# Patient Record
Sex: Male | Born: 1980 | Race: White | Hispanic: No | Marital: Single | Smoking: Current every day smoker
Health system: Southern US, Community
[De-identification: ages and names within clinical notes are randomized; demographics above are authoritative.]

## PROBLEM LIST (undated history)

## (undated) DIAGNOSIS — N289 Disorder of kidney and ureter, unspecified: Secondary | ICD-10-CM

## (undated) DIAGNOSIS — F319 Bipolar disorder, unspecified: Secondary | ICD-10-CM

## (undated) DIAGNOSIS — F32A Depression, unspecified: Secondary | ICD-10-CM

## (undated) DIAGNOSIS — F329 Major depressive disorder, single episode, unspecified: Secondary | ICD-10-CM

---

## 2001-11-13 ENCOUNTER — Encounter: Payer: Self-pay | Admitting: Orthopaedic Surgery

## 2001-11-13 ENCOUNTER — Encounter: Payer: Self-pay | Admitting: Emergency Medicine

## 2001-11-13 ENCOUNTER — Emergency Department (HOSPITAL_COMMUNITY): Admission: EM | Admit: 2001-11-13 | Discharge: 2001-11-13 | Payer: Self-pay | Admitting: Emergency Medicine

## 2009-01-17 ENCOUNTER — Emergency Department (HOSPITAL_COMMUNITY): Admission: EM | Admit: 2009-01-17 | Discharge: 2009-01-18 | Payer: Self-pay | Admitting: Emergency Medicine

## 2009-01-18 ENCOUNTER — Inpatient Hospital Stay (HOSPITAL_COMMUNITY): Admission: AD | Admit: 2009-01-18 | Discharge: 2009-01-19 | Payer: Self-pay | Admitting: Psychiatry

## 2009-01-18 ENCOUNTER — Ambulatory Visit: Payer: Self-pay | Admitting: Psychiatry

## 2010-04-01 LAB — CBC
HCT: 42.8 % (ref 39.0–52.0)
Hemoglobin: 14.6 g/dL (ref 13.0–17.0)
MCHC: 34.2 g/dL (ref 30.0–36.0)
MCV: 97.9 fL (ref 78.0–100.0)
Platelets: 274 10*3/uL (ref 150–400)
RBC: 4.37 MIL/uL (ref 4.22–5.81)
RDW: 13.2 % (ref 11.5–15.5)
WBC: 8.7 10*3/uL (ref 4.0–10.5)

## 2010-04-01 LAB — DIFFERENTIAL
Eosinophils Absolute: 0.5 10*3/uL (ref 0.0–0.7)
Lymphs Abs: 2.6 10*3/uL (ref 0.7–4.0)
Monocytes Relative: 7 % (ref 3–12)
Neutrophils Relative %: 58 % (ref 43–77)

## 2010-04-01 LAB — RAPID URINE DRUG SCREEN, HOSP PERFORMED
Amphetamines: NOT DETECTED
Barbiturates: NOT DETECTED
Benzodiazepines: POSITIVE — AB
Cocaine: POSITIVE — AB
Opiates: NOT DETECTED
Tetrahydrocannabinol: NOT DETECTED

## 2010-04-01 LAB — BASIC METABOLIC PANEL
BUN: 8 mg/dL (ref 6–23)
CO2: 30 mEq/L (ref 19–32)
Calcium: 9.5 mg/dL (ref 8.4–10.5)
Chloride: 103 mEq/L (ref 96–112)
Creatinine, Ser: 1.31 mg/dL (ref 0.4–1.5)
GFR calc Af Amer: >60 mL/min (ref >60)
GFR calc non Af Amer: >60 mL/min (ref >60)
Glucose, Bld: 92 mg/dL (ref 70–99)
Potassium: 3.9 mEq/L (ref 3.5–5.1)
Sodium: 140 mEq/L (ref 135–145)

## 2010-04-01 LAB — ETHANOL: Alcohol, Ethyl (B): <5 mg/dL (ref 0–10)

## 2010-04-01 LAB — HEPATIC FUNCTION PANEL
ALT: 23 U/L (ref 0–53)
AST: 19 U/L (ref 0–37)
Albumin: 4.3 g/dL (ref 3.5–5.2)
Alkaline Phosphatase: 70 U/L (ref 39–117)
Indirect Bilirubin: 0.2 mg/dL — ABNORMAL LOW (ref 0.3–0.9)
Total Protein: 6.9 g/dL (ref 6.0–8.3)

## 2012-05-18 ENCOUNTER — Emergency Department (HOSPITAL_COMMUNITY)
Admission: EM | Admit: 2012-05-18 | Discharge: 2012-05-19 | Disposition: A | Discharge status: Home / Self Care | Payer: Self-pay | Attending: Emergency Medicine | Admitting: Emergency Medicine

## 2012-05-18 ENCOUNTER — Encounter (HOSPITAL_COMMUNITY): Payer: Self-pay | Admitting: *Deleted

## 2012-05-18 DIAGNOSIS — T424X4A Poisoning by benzodiazepines, undetermined, initial encounter: Secondary | ICD-10-CM | POA: Insufficient documentation

## 2012-05-18 DIAGNOSIS — F17200 Nicotine dependence, unspecified, uncomplicated: Secondary | ICD-10-CM | POA: Insufficient documentation

## 2012-05-18 DIAGNOSIS — T43502A Poisoning by unspecified antipsychotics and neuroleptics, intentional self-harm, initial encounter: Secondary | ICD-10-CM | POA: Insufficient documentation

## 2012-05-18 DIAGNOSIS — T398X2A Poisoning by other nonopioid analgesics and antipyretics, not elsewhere classified, intentional self-harm, initial encounter: Secondary | ICD-10-CM | POA: Insufficient documentation

## 2012-05-18 DIAGNOSIS — T394X2A Poisoning by antirheumatics, not elsewhere classified, intentional self-harm, initial encounter: Secondary | ICD-10-CM | POA: Insufficient documentation

## 2012-05-18 DIAGNOSIS — F329 Major depressive disorder, single episode, unspecified: Secondary | ICD-10-CM | POA: Insufficient documentation

## 2012-05-18 DIAGNOSIS — T40601A Poisoning by unspecified narcotics, accidental (unintentional), initial encounter: Secondary | ICD-10-CM | POA: Insufficient documentation

## 2012-05-18 DIAGNOSIS — F319 Bipolar disorder, unspecified: Secondary | ICD-10-CM | POA: Insufficient documentation

## 2012-05-18 DIAGNOSIS — F3289 Other specified depressive episodes: Secondary | ICD-10-CM | POA: Insufficient documentation

## 2012-05-18 DIAGNOSIS — F1910 Other psychoactive substance abuse, uncomplicated: Secondary | ICD-10-CM

## 2012-05-18 DIAGNOSIS — T403X4A Poisoning by methadone, undetermined, initial encounter: Secondary | ICD-10-CM | POA: Insufficient documentation

## 2012-05-18 DIAGNOSIS — F1410 Cocaine abuse, uncomplicated: Secondary | ICD-10-CM | POA: Insufficient documentation

## 2012-05-18 HISTORY — DX: Bipolar disorder, unspecified: F31.9

## 2012-05-18 LAB — URINALYSIS, ROUTINE W REFLEX MICROSCOPIC
Bilirubin Urine: NEGATIVE
Hgb urine dipstick: NEGATIVE
Ketones, ur: NEGATIVE mg/dL
Protein, ur: NEGATIVE mg/dL
Specific Gravity, Urine: <1.005 — ABNORMAL LOW (ref 1.005–1.030)
Urobilinogen, UA: 0.2 mg/dL (ref 0.0–1.0)

## 2012-05-18 LAB — COMPREHENSIVE METABOLIC PANEL
BUN: 9 mg/dL (ref 6–23)
CO2: 30 mEq/L (ref 19–32)
Calcium: 9 mg/dL (ref 8.4–10.5)
Chloride: 104 mEq/L (ref 96–112)
Creatinine, Ser: 1.44 mg/dL — ABNORMAL HIGH (ref 0.50–1.35)
GFR calc non Af Amer: 63 mL/min — ABNORMAL LOW (ref >90)
Total Bilirubin: 0.2 mg/dL — ABNORMAL LOW (ref 0.3–1.2)

## 2012-05-18 LAB — CBC WITH DIFFERENTIAL/PLATELET
Hemoglobin: 13 g/dL (ref 13.0–17.0)
Lymphocytes Relative: 33 % (ref 12–46)
Lymphs Abs: 2.4 10*3/uL (ref 0.7–4.0)
Neutro Abs: 4.2 10*3/uL (ref 1.7–7.7)
Neutrophils Relative %: 58 % (ref 43–77)
Platelets: 267 10*3/uL (ref 150–400)
RBC: 3.92 MIL/uL — ABNORMAL LOW (ref 4.22–5.81)
WBC: 7.3 10*3/uL (ref 4.0–10.5)

## 2012-05-18 LAB — RAPID URINE DRUG SCREEN, HOSP PERFORMED
Amphetamines: NOT DETECTED
Benzodiazepines: POSITIVE — AB
Cocaine: NOT DETECTED
Opiates: NOT DETECTED
Tetrahydrocannabinol: POSITIVE — AB

## 2012-05-18 LAB — ETHANOL: Alcohol, Ethyl (B): <11 mg/dL (ref 0–11)

## 2012-05-18 MED ORDER — CLONIDINE HCL 0.1 MG PO TABS
0.1000 mg | ORAL_TABLET | Freq: Once | ORAL | Status: AC
  Administered 2012-05-18: 0.1 mg via ORAL
  Filled 2012-05-18: qty 1

## 2012-05-18 MED ORDER — NICOTINE 21 MG/24HR TD PT24
MEDICATED_PATCH | TRANSDERMAL | Status: AC
  Administered 2012-05-18: 21 mg via TRANSDERMAL
  Filled 2012-05-18: qty 1

## 2012-05-18 MED ORDER — KETOROLAC TROMETHAMINE 60 MG/2ML IM SOLN
60.0000 mg | Freq: Once | INTRAMUSCULAR | Status: AC
  Administered 2012-05-18: 60 mg via INTRAMUSCULAR
  Filled 2012-05-18: qty 2

## 2012-05-18 MED ORDER — LORAZEPAM 1 MG PO TABS
1.0000 mg | ORAL_TABLET | Freq: Once | ORAL | Status: AC
  Administered 2012-05-18: 1 mg via ORAL
  Filled 2012-05-18: qty 1

## 2012-05-18 MED ORDER — LORAZEPAM 1 MG PO TABS: 1.0000 mg | ORAL_TABLET | ORAL | Status: DC | PRN

## 2012-05-18 MED ORDER — IBUPROFEN 400 MG PO TABS
600.0000 mg | ORAL_TABLET | Freq: Three times a day (TID) | ORAL | Status: DC | PRN
  Administered 2012-05-18: 600 mg via ORAL
  Filled 2012-05-18: qty 2

## 2012-05-18 MED ORDER — ACETAMINOPHEN 325 MG PO TABS: 650.0000 mg | ORAL_TABLET | ORAL | Status: DC | PRN

## 2012-05-18 MED ORDER — LORAZEPAM 1 MG PO TABS
1.0000 mg | ORAL_TABLET | Freq: Once | ORAL | Status: AC
Start: 2012-05-18 — End: 2012-05-18
  Administered 2012-05-18: 1 mg via ORAL
  Filled 2012-05-18: qty 1

## 2012-05-18 MED ORDER — ZOLPIDEM TARTRATE 5 MG PO TABS
5.0000 mg | ORAL_TABLET | Freq: Every evening | ORAL | Status: DC | PRN
  Administered 2012-05-18: 5 mg via ORAL
  Filled 2012-05-18: qty 1

## 2012-05-18 MED ORDER — NICOTINE 21 MG/24HR TD PT24: 21.0000 mg | MEDICATED_PATCH | Freq: Once | TRANSDERMAL | Status: DC

## 2012-05-18 NOTE — ED Notes (Signed)
ACT consult in progress.
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
## 2012-05-18 NOTE — ED Provider Notes (Signed)
History    This chart was scribed for George Lennert, MD by Leone Payor, ED Scribe. This patient was seen in room [facility]15[facility] and the patient's care was started 2:30 PM.   CSN: [id number]  Arrival date & time 05/18/12  1243   First MD Initiated Contact with Patient 05/18/12 1429      No chief complaint on file.    The history is provided by the patient. No language interpreter was used.    ISIDOR BROMELL is a 32 y.o. male who presents to the Emergency Department complaining of SI 3 days ago. Pt reports using multiple illegal drugs including crack cocaine, valium, approximately 16 meth, 20 10mg  norco. Pt was involuntary committed about 4 years ago but has not sought treatment otherwise. Reports using drugs since he ws 32 years old. Pt has h/o bipolar 1 disease. Pt is a current everyday smoker and occasional alcohol user.   Past Medical History  Diagnosis Date  . Bipolar 1 disorder     History reviewed. No pertinent past surgical history.  History reviewed. No pertinent family history.  History  Substance Use Topics  . Smoking status: Current Every Day Smoker    Types: Cigarettes  . Smokeless tobacco: Not on file  . Alcohol Use: Yes      Review of Systems  Constitutional: Negative for appetite change and fatigue.  HENT: Negative for congestion, sinus pressure and ear discharge.   Eyes: Negative for discharge.  Respiratory: Negative for cough.   Cardiovascular: Negative for chest pain.  Gastrointestinal: Negative for abdominal pain and diarrhea.  Genitourinary: Negative for frequency and hematuria.  Musculoskeletal: Negative for back pain.  Skin: Negative for rash.  Neurological: Negative for seizures and headaches.  Psychiatric/Behavioral: Positive for suicidal ideas. Negative for hallucinations.    Allergies  Review of patient's allergies indicates no known allergies.  Home Medications   Current Outpatient Rx  Name  Route  Sig  Dispense  Refill  .  acetaminophen (TYLENOL) 500 MG tablet   Oral   Take 500 mg by mouth every 6 (six) hours as needed for pain.           BP 123/80  Pulse 81  Temp(Src) 98 F (36.7 C) (Oral)  Resp 20  Ht 5\' 11"  (1.803 m)  Wt 155 lb (70.308 kg)  BMI 21.63 kg/m2  SpO2 96%  Physical Exam  Nursing note and vitals reviewed. Constitutional: He is oriented to person, place, and time. He appears well-developed.  HENT:  Head: Normocephalic.  Eyes: Conjunctivae and EOM are normal. No scleral icterus.  Neck: Neck supple. No thyromegaly present.  Cardiovascular: Normal rate and regular rhythm.  Exam reveals no gallop and no friction rub.   No murmur heard. Pulmonary/Chest: No stridor. He has no wheezes. He has no rales. He exhibits no tenderness.  Abdominal: He exhibits no distension. There is no tenderness. There is no rebound.  Musculoskeletal: Normal range of motion. He exhibits no edema.  Lymphadenopathy:    He has no cervical adenopathy.  Neurological: He is oriented to person, place, and time. Coordination normal.  Skin: No rash noted. No erythema.  Psychiatric: His behavior is normal. He exhibits a depressed mood. He expresses suicidal ideation. He expresses suicidal plans.    ED Course  Procedures (including critical care time)  DIAGNOSTIC STUDIES: Oxygen Saturation is 96% on room air, adequate by my interpretation.    COORDINATION OF CARE: 2:40 PM Discussed treatment plan with pt at bedside and pt  agreed to plan.   Labs Reviewed  COMPREHENSIVE METABOLIC PANEL - Abnormal; Notable for the following:    Creatinine, Ser 1.44 (*)    Total Bilirubin 0.2 (*)    GFR calc non Af Amer 63 (*)    GFR calc Af Amer 73 (*)    All other components within normal limits  CBC WITH DIFFERENTIAL - Abnormal; Notable for the following:    RBC 3.92 (*)    HCT 38.0 (*)    All other components within normal limits  URINE RAPID DRUG SCREEN (HOSP PERFORMED) - Abnormal; Notable for the following:     Benzodiazepines POSITIVE (*)    Tetrahydrocannabinol POSITIVE (*)    All other components within normal limits  URINALYSIS, ROUTINE W REFLEX MICROSCOPIC - Abnormal; Notable for the following:    Specific Gravity, Urine <1.005 (*)    All other components within normal limits  ETHANOL  ACETAMINOPHEN LEVEL   No results found.   No diagnosis found.    MDM  Substance abuse and suicidal      The chart was scribed for me under my direct supervision.  I personally performed the history, physical, and medical decision making and all procedures in the evaluation of this patient.George Lennert, MD 05/19/12 [phone]

## 2012-05-18 NOTE — ED Notes (Addendum)
Pt says he tried to kill himself on Friday , by using multiple illegal drugs, cocaine, meth, norco and valium.  Wanded at triage

## 2012-05-18 NOTE — ED Notes (Signed)
Pt alert, oriented and cooperative.  Reporting generalized pain and increased anxiety.  Discussed with EDP, order for IM toradol and p.o ativan given.  Pt denies any additional needs at present time.

## 2012-05-18 NOTE — ED Notes (Signed)
Pt denies relief of pain, but does appear less anxious.

## 2012-05-18 NOTE — ED Notes (Addendum)
Yolanda, from [facility], notified pt in ED and needs evaluation. Pt to be seen in ED by [facility].

## 2012-05-19 ENCOUNTER — Encounter (HOSPITAL_COMMUNITY): Payer: Self-pay

## 2012-05-19 ENCOUNTER — Inpatient Hospital Stay (HOSPITAL_COMMUNITY)
Admission: EM | Admit: 2012-05-19 | Discharge: 2012-05-22 | DRG: 897 | Disposition: A | Discharge status: Home / Self Care | Payer: 59 | Source: Intra-hospital | Attending: Psychiatry | Admitting: Psychiatry

## 2012-05-19 DIAGNOSIS — Z79899 Other long term (current) drug therapy: Secondary | ICD-10-CM

## 2012-05-19 DIAGNOSIS — F1920 Other psychoactive substance dependence, uncomplicated: Secondary | ICD-10-CM | POA: Diagnosis present

## 2012-05-19 DIAGNOSIS — F1010 Alcohol abuse, uncomplicated: Secondary | ICD-10-CM | POA: Diagnosis present

## 2012-05-19 DIAGNOSIS — F3130 Bipolar disorder, current episode depressed, mild or moderate severity, unspecified: Secondary | ICD-10-CM | POA: Diagnosis present

## 2012-05-19 DIAGNOSIS — F39 Unspecified mood [affective] disorder: Secondary | ICD-10-CM | POA: Diagnosis present

## 2012-05-19 DIAGNOSIS — F1120 Opioid dependence, uncomplicated: Principal | ICD-10-CM | POA: Diagnosis present

## 2012-05-19 DIAGNOSIS — F411 Generalized anxiety disorder: Secondary | ICD-10-CM | POA: Diagnosis present

## 2012-05-19 HISTORY — DX: Depression, unspecified: F32.A

## 2012-05-19 HISTORY — DX: Major depressive disorder, single episode, unspecified: F32.9

## 2012-05-19 MED ORDER — ADULT MULTIVITAMIN W/MINERALS CH
1.0000 | ORAL_TABLET | Freq: Every day | ORAL | Status: DC
  Administered 2012-05-19 – 2012-05-22 (×4): 1 via ORAL
  Filled 2012-05-19 (×6): qty 1

## 2012-05-19 MED ORDER — MAGNESIUM HYDROXIDE 400 MG/5ML PO SUSP: 30.0000 mL | Freq: Every day | ORAL | Status: DC | PRN

## 2012-05-19 MED ORDER — TRAZODONE HCL 50 MG PO TABS
50.0000 mg | ORAL_TABLET | Freq: Every evening | ORAL | Status: DC | PRN
  Administered 2012-05-19 – 2012-05-21 (×3): 50 mg via ORAL
  Filled 2012-05-19 (×11): qty 1

## 2012-05-19 MED ORDER — HYDROXYZINE HCL 25 MG PO TABS
25.0000 mg | ORAL_TABLET | Freq: Four times a day (QID) | ORAL | Status: AC | PRN
  Administered 2012-05-19 – 2012-05-20 (×2): 25 mg via ORAL

## 2012-05-19 MED ORDER — THIAMINE HCL 100 MG/ML IJ SOLN: 100.0000 mg | Freq: Once | INTRAMUSCULAR | Status: DC

## 2012-05-19 MED ORDER — CLONIDINE HCL 0.1 MG PO TABS
0.1000 mg | ORAL_TABLET | Freq: Four times a day (QID) | ORAL | Status: AC
  Administered 2012-05-19 – 2012-05-20 (×8): 0.1 mg via ORAL
  Filled 2012-05-19 (×9): qty 1

## 2012-05-19 MED ORDER — VITAMIN B-1 100 MG PO TABS
100.0000 mg | ORAL_TABLET | Freq: Every day | ORAL | Status: DC
  Administered 2012-05-20 – 2012-05-22 (×3): 100 mg via ORAL
  Filled 2012-05-19 (×5): qty 1

## 2012-05-19 MED ORDER — NAPROXEN 500 MG PO TABS
500.0000 mg | ORAL_TABLET | Freq: Two times a day (BID) | ORAL | Status: DC | PRN
  Administered 2012-05-21: 500 mg via ORAL
  Filled 2012-05-19: qty 28
  Filled 2012-05-19: qty 1

## 2012-05-19 MED ORDER — CLONIDINE HCL 0.1 MG PO TABS
0.1000 mg | ORAL_TABLET | ORAL | Status: DC
  Administered 2012-05-21 – 2012-05-22 (×3): 0.1 mg via ORAL
  Filled 2012-05-19 (×4): qty 1

## 2012-05-19 MED ORDER — DICYCLOMINE HCL 20 MG PO TABS: 20.0000 mg | ORAL_TABLET | Freq: Four times a day (QID) | ORAL | Status: DC | PRN

## 2012-05-19 MED ORDER — NICOTINE 14 MG/24HR TD PT24
14.0000 mg | MEDICATED_PATCH | Freq: Every day | TRANSDERMAL | Status: DC
  Administered 2012-05-19 – 2012-05-22 (×4): 14 mg via TRANSDERMAL
  Filled 2012-05-19 (×7): qty 1

## 2012-05-19 MED ORDER — LOPERAMIDE HCL 2 MG PO CAPS: 2.0000 mg | ORAL_CAPSULE | ORAL | Status: AC | PRN

## 2012-05-19 MED ORDER — METHOCARBAMOL 500 MG PO TABS
500.0000 mg | ORAL_TABLET | Freq: Three times a day (TID) | ORAL | Status: DC | PRN
  Filled 2012-05-19: qty 1

## 2012-05-19 MED ORDER — CHLORDIAZEPOXIDE HCL 25 MG PO CAPS
25.0000 mg | ORAL_CAPSULE | Freq: Four times a day (QID) | ORAL | Status: AC | PRN
  Administered 2012-05-19: 25 mg via ORAL
  Filled 2012-05-19: qty 1

## 2012-05-19 MED ORDER — ALUM & MAG HYDROXIDE-SIMETH 200-200-20 MG/5ML PO SUSP: 30.0000 mL | ORAL | Status: DC | PRN

## 2012-05-19 MED ORDER — CLONIDINE HCL 0.1 MG PO TABS
0.1000 mg | ORAL_TABLET | Freq: Every day | ORAL | Status: DC
  Filled 2012-05-19 (×2): qty 1

## 2012-05-19 MED ORDER — ONDANSETRON 4 MG PO TBDP: 4.0000 mg | ORAL_TABLET | Freq: Four times a day (QID) | ORAL | Status: AC | PRN

## 2012-05-19 MED ORDER — ACETAMINOPHEN 325 MG PO TABS: 650.0000 mg | ORAL_TABLET | Freq: Four times a day (QID) | ORAL | Status: DC | PRN

## 2012-05-19 NOTE — [facility] Group Notes (Signed)
[facility] LCSW Group Therapy  05/19/2012 1:15 PM  Type of Therapy:  Group Therapy 1:15 to 2:30   Participation Level: Present, attention fluctuated  Participation Quality: Minimal  Affect: Drowsy, flat  Cognitive: Oriented  Insight:  Limited  Engagement in Therapy:  Minimal  Modes of Intervention:  Education, Discussion, socialization and support  Summary of Progress/Problems: Patient attended group presentation by staff member of  [facility] ([facility]). [name] was appropriate during session, yet did doze off intermently.    George Holloway

## 2012-05-19 NOTE — ED Notes (Signed)
Report given to [facility]  

## 2012-05-19 NOTE — Progress Notes (Signed)
Pt. Is a 22yr. Old male who admitted [facility] from [facility] requesting help with substance abuse, and depression.   Pt. States that he started taking drugs and drinking when he was 12 yrs,. Old and has never spent one day without drinking or doing drugs.  Pt. States he smokes 1/2 ounce THC daily, $100 worth of crack week, and meth., and drinks appr. 24ounces of beer a day.  Pt. Says he has a 24yr. Old son and a girlfriend that is expecting another child.  Pt. And his son Live with his Grandmother.  This is his reason for wanting to quit.  Pt. Has multiple tattoos over his neck, arms and chest.   Pt. States that he attempted to OD on drugs Friday and went to sleep for two days.  Unsure if pt. Is exaggerating his drug use, he was positive for Benzo's and THC.  Pt. Was cooperative during his admission.  Pt. Was escorted to the 300 hall where he was offered food.   Pt. Denies SI and HI at this time.

## 2012-05-19 NOTE — ED Notes (Signed)
Patient is resting at this time. Equal rise and fall of chest.

## 2012-05-19 NOTE — Tx Team (Signed)
Initial Interdisciplinary Treatment Plan  PATIENT STRENGTHS: (choose at least two) Average or above average intelligence Communication skills  PATIENT STRESSORS: Legal issue Marital or family conflict Substance abuse   PROBLEM LIST: Problem List/Patient Goals Date to be addressed Date deferred Reason deferred Estimated date of resolution  Pt. Wants to detox/ states he has not been clean since he was 32yrs old.      Hx-bipolar d/o      depression                                           DISCHARGE CRITERIA:  Improved stabilization in mood, thinking, and/or behavior Motivation to continue treatment in a less acute level of care Verbal commitment to aftercare and medication compliance  PRELIMINARY DISCHARGE PLAN: Attend PHP/IOP Attend 12-step recovery group Participate in family therapy  PATIENT/FAMIILY INVOLVEMENT: This treatment plan has been presented to and reviewed with the patient, George Holloway, and/or family member, .  The patient and family have been given the opportunity to ask questions and make suggestions.  Cooper Render 05/19/2012, 4:20 AM

## 2012-05-19 NOTE — Progress Notes (Addendum)
Recreation Therapy Notes  Date: 05.06.2014 Time: 3:00pm Location: 300 Hall Day Room      Group Topic/Focus: Communication, Journalist, newspaper, Team Building  Participation Level: Active  Participation Quality: Appropriate  Affect: Euthymic  Cognitive: Appropriate   Additional Comments: Activity: Flip Flop ; Explanation: LRT placed a sheet on the floor. Patients were instructed to stand on the sheet. As a group patients were instructed to flip the sheet over without stepping off of the sheet.   Patient actively particiapted in group activity. Patient emerged as a leader during this activity giving the proper instruction to peers to successfully complete task. Patient contributed to group discussion about the importance of using communication skills, team building skills and problem solving skills outside of hospital to build a support system for recovery.   Marykay Lex Makyla Bye, LRT/CTRS  Jearl Klinefelter 05/19/2012 4:47 PM
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
## 2012-05-19 NOTE — Progress Notes (Signed)
Pt was very agitated this morning upon first assessment. He was given librium 25 mg along with multi vitamin and nicotine patch. He was demanding pain pills he stated,"I have been taking oxy 60 mg roxi's 30 mg along with smoking ice daily".  Tried to explain that he had this medication to help him until doctor is able to see him.  He threatened to "bust up out here" if he didn't get what he needed.  Again, tried to explain to him and he walked away.  He did come back and apologized for his behavior.  Dr. Dub Mikes placed pt on clonidine protocol explained to pt how that could help him and he needed to stay hydrated so he would meet the parameters for the clondine.  He voiced understanding.

## 2012-05-19 NOTE — ED Notes (Signed)
Report called to [facility].  Pt has been accepted. Room 301 bed 2.

## 2012-05-19 NOTE — [facility] Counselor (Signed)
Hattie Perch, [facility] counselor at [facility], submitted Pt for admission to [facility]. Akeysha McMurren, AC confirmed bed availability. Donell Sievert, PA reviewed clinical information and accepted Pt to the service of Dr. Geoffery Lyons, room 301-2. Notified Hattie Perch and Tiana Loft, RN of acceptance.  Harlin Rain Patsy Baltimore, LPC, [facility] Assessment Counselor

## 2012-05-19 NOTE — ED Notes (Signed)
Pt transferred to [facility] via [facility].

## 2012-05-19 NOTE — [facility] Group Notes (Signed)
Adult Psychoeducational Group Note  Date:  05/19/2012 Time:  10:00am Group Topic/Focus:  Recovery Goals:   The focus of this group is to identify appropriate goals for recovery and establish a plan to achieve them.  Participation Level:  Active  Participation Quality:  Appropriate and Attentive  Affect:  Appropriate  Cognitive:  Appropriate  Insight: Appropriate  Engagement in Group:  Engaged  Modes of Intervention:  Support  Additional Comments:  Staff explained to the patient that the purpose of this group is to educate them on recovery, and on setting mid-range to long-term personal goals for their recovery process.  Staff also explained that the group will also address topics including what recovery is, who goes through recovery, the first steps toward recovery, and setting realistic goals for recovery. Patient was asked to identify two areas of their life in which they would like to make a change toward recovery, and set a specific measurable goal to address each change identified. Patient was provided with a homework assignment regarding how this goal impacts their personal recovery. Staff concluded the group by encouraging the patient to take a proactive approach to accomplishing their recovery goals.  Ardelle Park O 05/19/2012, 11:20 AM
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
## 2012-05-19 NOTE — [facility] Group Notes (Signed)
[facility] LCSW Aftercare Discharge Planning Group Note   05/19/2012 8:45 AM  Participation Quality:  Appropriate  Mood/Affect:  Excited  Depression Rating:  Unwilling to rate  Anxiety Rating:  Unwilling to rate  Thoughts of Suicide:  No Will you contract for safety?   NA  Current AVH:  No  Plan for Discharge/Comments:  Would like to go to inpatient treatment   Transportation Means:  Grandmother  Supports: Grandmother  Dyane Dustman, Julious Payer
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
## 2012-05-20 DIAGNOSIS — F639 Impulse disorder, unspecified: Secondary | ICD-10-CM

## 2012-05-20 DIAGNOSIS — F1920 Other psychoactive substance dependence, uncomplicated: Secondary | ICD-10-CM

## 2012-05-20 DIAGNOSIS — F39 Unspecified mood [affective] disorder: Secondary | ICD-10-CM

## 2012-05-20 MED ORDER — QUETIAPINE FUMARATE 50 MG PO TABS: 50.0000 mg | ORAL_TABLET | Freq: Three times a day (TID) | ORAL | Status: DC | PRN

## 2012-05-20 MED ORDER — CARBAMAZEPINE ER 100 MG PO CP12
200.0000 mg | ORAL_CAPSULE | Freq: Two times a day (BID) | ORAL | Status: DC
  Administered 2012-05-20 – 2012-05-22 (×4): 200 mg via ORAL
  Filled 2012-05-20: qty 56
  Filled 2012-05-20 (×5): qty 2
  Filled 2012-05-20: qty 56
  Filled 2012-05-20: qty 2

## 2012-05-20 NOTE — Progress Notes (Signed)
Pt reports he is doing better this evening.  He said he was irritable this morning, but felt he was "getting the run around".  He said since he was able to get some medications, he is doing better.  He is pleasant/cooperative with this Clinical research associate.  He denies SI/HI/AV.  He voices no needs at this time.  Reviewed meds with pt.  He says his withdrawals are still moderate, but tolerable.  He reports he has attended some groups.  He is thinking about going to long term treatment after detox and wants to stay sober.  Support and encouragement offered.  Safety maintained with q15 minute checks.

## 2012-05-20 NOTE — Progress Notes (Signed)
Pt reports sleep as fair even after taking his trazodone.  We discussed the fact that he has been in bed and not going to groups as he should.  He will get up and go to meals and dayroom with peers watching TV.  He did voice understanding and stated,"there is noting I can get out of those groups anyway" He reported his appetite is improving energy level normal and his ability to pay attention as poor.  He rated his depression a 3 hopelessness 2 and anxiety a 6 on his self-inventory.  He was started today on seroquel 50 mg TID prn for agitation and carbamazepine 12 hour capsule 200 mg.  He was ranting and raving because his pulse and BP did not meet the parameters for the clonidine before lunch.  He stated,"I have asked the techs 3 times for something to drink and I never got anything" We discussed the fact that he could have asked to talk with his nurse or either come to the nurses's station as he has many times before.  He came back from lunch and he was able to take the clonidine.  He was 97/63 and pulse 45 before lunch after lunch he was 122/78 pulse 68. He took vistaril 25 mg at 1306 which was helpful for him.  He has since been calm and cooperative and apologized for his behavior. He is hoping to get into [facility]

## 2012-05-20 NOTE — [facility] Group Notes (Signed)
[facility] LCSW Group Therapy  05/20/2012 1:45 PM  Type of Therapy:  Group Therapy 1:15 to 2:30  Participation Level:  Active  Participation Quality:  Appropriate  Affect:  Excited  Cognitive:  Alert and Oriented  Insight:  Limited  Engagement in Therapy:  Developing/Improving  Modes of Intervention:  Activity, Discussion, Exploration, Socialization and Support  Summary of Progress/Problems: Focus of group discussion today was feelings related to one's diagnosis of substance abuse, after completing a self test and discussing denial, bargaining, anger, depression, and acceptance to which patients related to in varying degrees.  Khayri was able to process how much his drug use has been apart of his life.  "I reached under the bed this morning for my drugs, that's how instinctual it is for me."  Others in group offered understanding and acceptance.  Patient continues to report "I've had enough."  Harrill, Julious Payer
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
## 2012-05-20 NOTE — Progress Notes (Signed)
[facility] MD Progress Note  05/20/2012 3:00 PM George Holloway  MRN:  [id number] Subjective:  Admits to mood instability. States he gets angry very easily. Has seen himself close to losing control. Says he would not hit anybody, he would probably hit a wall. States that this anger has kept him from being able to get and keep jobs. He wants to get better. He has conflictive interactions with his girlfriend and does not plan to be with her, but wants to be a father to his daughter. Diagnosis:  Polysubstance Dependence, Mood Disorder NOS, Impulse Control NOS  ADL's:  Intact  Sleep: Fair  Appetite:  Fair  Suicidal Ideation:  Plan:  denies Intent:  denies Means:  denies Homicidal Ideation:  Plan:  denies Intent:  denies Means:  denies AEB (as evidenced by):  Psychiatric Specialty Exam: Review of Systems  Constitutional: Negative.   HENT: Positive for neck pain.   Eyes: Negative.   Respiratory: Negative.   Cardiovascular: Negative.   Gastrointestinal: Negative.   Genitourinary: Negative.   Musculoskeletal: Positive for back pain and joint pain.  Skin: Negative.   Neurological: Negative.   Endo/Heme/Allergies: Negative.   Psychiatric/Behavioral: Positive for substance abuse. The patient is nervous/anxious and has insomnia.     Blood pressure 122/78, pulse 54, temperature 97.5 F (36.4 C), temperature source Oral, resp. rate 16, height 6' 9.5" (2.07 m).There is no weight on file to calculate BMI.  General Appearance: Fairly Groomed  Patent attorney::  Fair  Speech:  Clear and Coherent  Volume:  fluctuates  Mood:  Anxious, Depressed, Dysphoric and Irritable  Affect:  Labile  Thought Process:  Coherent and Goal Directed  Orientation:  Full (Time, Place, and Person)  Thought Content:  worries, concerns, fear of losing control  Suicidal Thoughts:  No (intermittent thoughts)  Homicidal Thoughts:  No  Memory:  Immediate;   Fair Recent;   Fair Remote;   Fair  Judgement:  Fair  Insight:   Shallow  Psychomotor Activity:  Restlessness  Concentration:  Fair  Recall:  Fair  Akathisia:  No  Handed:  Right  AIMS (if indicated):     Assets:  Desire for Improvement  Sleep:  Number of Hours: 6   Current Medications: Current Facility-Administered Medications  Medication Dose Route Frequency Provider Last Rate Last Dose  . acetaminophen (TYLENOL) tablet 650 mg  650 mg Oral Q6H PRN Kerry Hough, PA-C      . alum & mag hydroxide-simeth (MAALOX/MYLANTA) 200-200-20 MG/5ML suspension 30 mL  30 mL Oral Q4H PRN Kerry Hough, PA-C      . carbamazepine (EQUETRO) 12 hr capsule 200 mg  200 mg Oral BID Rachael Fee, MD      . chlordiazePOXIDE (LIBRIUM) capsule 25 mg  25 mg Oral Q6H PRN Kerry Hough, PA-C   25 mg at 05/19/12 0806  . cloNIDine (CATAPRES) tablet 0.1 mg  0.1 mg Oral QID Rachael Fee, MD   0.1 mg at 05/20/12 1302   Followed by  . [START ON 05/21/2012] cloNIDine (CATAPRES) tablet 0.1 mg  0.1 mg Oral BH-qamhs Rachael Fee, MD       Followed by  . [START ON 05/23/2012] cloNIDine (CATAPRES) tablet 0.1 mg  0.1 mg Oral QAC breakfast Rachael Fee, MD      . dicyclomine (BENTYL) tablet 20 mg  20 mg Oral Q6H PRN Rachael Fee, MD      . hydrOXYzine (ATARAX/VISTARIL) tablet 25 mg  25 mg Oral  Q6H PRN Kerry Hough, PA-C   25 mg at 05/20/12 1306  . loperamide (IMODIUM) capsule 2-4 mg  2-4 mg Oral PRN Kerry Hough, PA-C      . magnesium hydroxide (MILK OF MAGNESIA) suspension 30 mL  30 mL Oral Daily PRN Kerry Hough, PA-C      . methocarbamol (ROBAXIN) tablet 500 mg  500 mg Oral Q8H PRN Rachael Fee, MD      . multivitamin with minerals tablet 1 tablet  1 tablet Oral Daily Kerry Hough, PA-C   1 tablet at 05/20/12 0756  . naproxen (NAPROSYN) tablet 500 mg  500 mg Oral BID PRN Rachael Fee, MD      . nicotine (NICODERM CQ - dosed in mg/24 hours) patch 14 mg  14 mg Transdermal Q0600 Kerry Hough, PA-C   14 mg at 05/20/12 9147  . ondansetron (ZOFRAN-ODT) disintegrating  tablet 4 mg  4 mg Oral Q6H PRN Kerry Hough, PA-C      . QUEtiapine (SEROQUEL) tablet 50 mg  50 mg Oral TID PRN Rachael Fee, MD      . thiamine (B-1) injection 100 mg  100 mg Intramuscular Once Kerry Hough, PA-C      . thiamine (VITAMIN B-1) tablet 100 mg  100 mg Oral Daily Kerry Hough, PA-C   100 mg at 05/20/12 0756  . traZODone (DESYREL) tablet 50 mg  50 mg Oral QHS,MR X 1 Kerry Hough, PA-C   50 mg at 05/19/12 2158    Lab Results: No results found for this or any previous visit (from the past 48 hour(s)).  Physical Findings: AIMS: Facial and Oral Movements Muscles of Facial Expression: None, normal Lips and Perioral Area: None, normal Jaw: None, normal Tongue: None, normal,Extremity Movements Upper (arms, wrists, hands, fingers): None, normal Lower (legs, knees, ankles, toes): None, normal, Trunk Movements Neck, shoulders, hips: None, normal, Overall Severity Severity of abnormal movements (highest score from questions above): None, normal Incapacitation due to abnormal movements: None, normal Patient's awareness of abnormal movements (rate only patient's report): No Awareness, Dental Status Current problems with teeth and/or dentures?: Yes Does patient usually wear dentures?: No  CIWA:  CIWA-Ar Total: 6 COWS:  COWS Total Score: 3  Treatment Plan Summary: Daily contact with patient to assess and evaluate symptoms and progress in treatment Medication management  Plan: Supportive approach/coping skills/relapse prevention/Anger management           Pursue and complete the detox           Trial with Equetro 200 mg BID (Had a previous trial with Depakote but he did not "feel           right" on it.)           Seroquel 50 mg TID PRN Medical Decision Making Problem Points:  Review of psycho-social stressors (1) Data Points:  Review of medication regiment & side effects (2) Review of new medications or change in dosage (2)  I certify that inpatient services furnished  can reasonably be expected to improve the patient's condition.   Wendle Kina A 05/20/2012, 3:00 PM

## 2012-05-20 NOTE — Progress Notes (Signed)
Interdisciplinary Treatment Plan Update (Adult)  Date: 05/20/2012  Time Reviewed: 9:39 AM   Progress in Treatment: Attending groups: Yes Participating in groups: Not as yet Taking medication as prescribed:  Yes Tolerating medication:  Yes Family/Significant othe contact made: Not as yet Patient understands diagnosis: Yes Discussing patient identified problems/goals with staff: Yes Medical problems stabilized or resolved:  Yes Denies suicidal/homicidal ideation: Yes Patient has not harmed self or Others: Yes  New problem(s) identified: None Identified  Discharge Plan or Barriers:  CSW has referred patient to [facility] and is assessing for additional referrals.   Additional comments: N/A  Reason for Continuation of Hospitalization: Anxiety Medication stabilization Withdrawal symptoms   Estimated length of stay: 3 days  For review of initial/current patient goals, please see plan of care.  Attendees: Patient:     Family:     Physician:  Geoffery Lyons 05/20/2012 9:39 AM   Nursing:   Roswell Miners, RN 05/20/2012 9:39 AM   Clinical Social Worker Ronda Fairly 05/20/2012 9:39 AM   Other:  Harold Barban, RN 05/20/2012 9:39 AM   Other:  Robbie Louis, RN 05/20/2012 9:39 AM   Other:  Idelle Jo PA Student 05/20/2012 9:39 AM   Other:   05/20/2012 9:39 AM    Scribe for Treatment Team:   Carney Bern, LCSWA  05/20/2012 9:39 AM

## 2012-05-21 DIAGNOSIS — F1920 Other psychoactive substance dependence, uncomplicated: Principal | ICD-10-CM

## 2012-05-21 MED ORDER — QUETIAPINE FUMARATE 50 MG PO TABS
50.0000 mg | ORAL_TABLET | Freq: Three times a day (TID) | ORAL | Status: DC
  Administered 2012-05-21 – 2012-05-22 (×4): 50 mg via ORAL
  Filled 2012-05-21: qty 42
  Filled 2012-05-21: qty 1
  Filled 2012-05-21: qty 42
  Filled 2012-05-21: qty 1
  Filled 2012-05-21: qty 42
  Filled 2012-05-21 (×4): qty 1

## 2012-05-21 MED ORDER — QUETIAPINE FUMARATE 50 MG PO TABS
50.0000 mg | ORAL_TABLET | Freq: Once | ORAL | Status: AC
  Administered 2012-05-21: 50 mg via ORAL
  Filled 2012-05-21 (×2): qty 1

## 2012-05-21 NOTE — Progress Notes (Signed)
D:  Patient's self inventory sheet, patient has fair sleep, good appetite, normal energy level, poor attention span.  Rated depression and hopelessness #1.  Still experiencing cravings.  Denied SI.  Has experienced pain in past 24 hours.  Worst pain #6.  After discharge, plans to get a job.  No problems taking meds after discharge. A:  Medications administered per MD orders.  Emotional support and encouragement given throughout day. R:  Patient denied SI and HI.  Denied A/V hallucinations.  Denied pain.  Patient remains safe on unit.  Will continue to monitor patient's safety with 15 minute checks.

## 2012-05-21 NOTE — [facility] Suicide Risk Assessment (Signed)
[facility] INPATIENT:  Family/Significant Other Suicide Prevention Education  Suicide Prevention Education:  Patient Discharged to Other Healthcare Facility:  Suicide Prevention Education Not Provided: {PT. DISCHARGED TO OTHER HEALTHCARE FACILITY:SUICIDE PREVENTION EDUCATION NOT PROVIDED (CHL):  The patient is discharging to another healthcare facility, plan at this time to go to [facility] on 05/22/12,  for continuation of treatment. The patient's medical information, including suicide ideations and risk factors, are a part of the medical information shared with the receiving healthcare facility.  Clide Dales 05/21/2012, 2:51 PM

## 2012-05-21 NOTE — [facility] Group Notes (Signed)
[facility] LCSW Aftercare Discharge Planning Group Note   05/21/2012  Participation Quality:  Appropriate  Mood/Affect:  Appropriate  Depression Rating:  2  Anxiety Rating:  3-4  Thoughts of Suicide:  No Will you contract for safety?   NA  Current AVH:  No  Plan for Discharge/Comments:  CSW to contact [facility] for bed availability  Transportation Means:  Family if home, [facility] if transfer  Supports: Family  Harrill, Julious Payer

## 2012-05-21 NOTE — Progress Notes (Signed)
Patient ID: George Holloway, male   DOB: 02/08/1980, 32 y.o.   MRN: [id number] Bed availability looks good for patient tomorrow; CSW has given Lead CSW's number to [facility], The admissions department at 2:49 PM as admissions has not been able to complete intake assessment as yet today and they change staff at 3:00 PM.   Admissions also familiar with other CSWs who work in clinical suite and will contact someone tomorrow. Carney Bern, LCSWA  05/21/2012

## 2012-05-21 NOTE — Progress Notes (Signed)
[facility] MD Progress Note  05/21/2012 3:43 PM BRICE KOSSMAN  MRN:  [id number] Subjective:  George Holloway is still endorsing irritability. Would like to have the Seroquel in place. Has tolerated the Equetro well so far.He wants to go to a rehab facility to continue to work on his recovery. States that when everything is said and done, he would rather get out of [place] Diagnosis:  Polysubstance Dependence, Mood Disorder NOS  ADL's:  Intact  Sleep: Fair  Appetite:  Fair  Suicidal Ideation:  Plan:  denies Intent:  denies Means:  denies Homicidal Ideation:  Plan:  denies Intent:  denies Means:  denies AEB (as evidenced by):  Psychiatric Specialty Exam: Review of Systems  Constitutional: Negative.   HENT: Negative.   Eyes: Negative.   Respiratory: Negative.   Cardiovascular: Negative.   Gastrointestinal: Negative.   Genitourinary: Negative.   Musculoskeletal: Negative.   Skin: Negative.   Neurological: Negative.   Endo/Heme/Allergies: Negative.   Psychiatric/Behavioral: Positive for substance abuse. The patient is nervous/anxious.        Irritability    Blood pressure 112/68, pulse 65, temperature 97.8 F (36.6 C), temperature source Oral, resp. rate 16, height 6' 9.5" (2.07 m).There is no weight on file to calculate BMI.  General Appearance: Disheveled  Eye Solicitor::  Fair  Speech:  Clear and Coherent  Volume:  fluctuates  Mood:  Anxious and Irritable  Affect:  Restricted  Thought Process:  Coherent and Goal Directed  Orientation:  Full (Time, Place, and Person)  Thought Content:  worries, concerns  Suicidal Thoughts:  No  Homicidal Thoughts:  No  Memory:  Immediate;   Fair Recent;   Fair Remote;   Fair  Judgement:  Fair  Insight:  Present  Psychomotor Activity:  Restlessness  Concentration:  Fair  Recall:  Fair  Akathisia:  No  Handed:  Right  AIMS (if indicated):     Assets:  Desire for Improvement  Sleep:  Number of Hours: 5.75   Current Medications: Current  Facility-Administered Medications  Medication Dose Route Frequency Provider Last Rate Last Dose  . acetaminophen (TYLENOL) tablet 650 mg  650 mg Oral Q6H PRN Kerry Hough, PA-C      . alum & mag hydroxide-simeth (MAALOX/MYLANTA) 200-200-20 MG/5ML suspension 30 mL  30 mL Oral Q4H PRN Kerry Hough, PA-C      . carbamazepine (EQUETRO) 12 hr capsule 200 mg  200 mg Oral BID Rachael Fee, MD   200 mg at 05/21/12 0805  . chlordiazePOXIDE (LIBRIUM) capsule 25 mg  25 mg Oral Q6H PRN Kerry Hough, PA-C   25 mg at 05/19/12 0806  . cloNIDine (CATAPRES) tablet 0.1 mg  0.1 mg Oral BH-qamhs Rachael Fee, MD   0.1 mg at 05/21/12 4098   Followed by  . [START ON 05/23/2012] cloNIDine (CATAPRES) tablet 0.1 mg  0.1 mg Oral QAC breakfast Rachael Fee, MD      . dicyclomine (BENTYL) tablet 20 mg  20 mg Oral Q6H PRN Rachael Fee, MD      . hydrOXYzine (ATARAX/VISTARIL) tablet 25 mg  25 mg Oral Q6H PRN Kerry Hough, PA-C   25 mg at 05/20/12 1306  . loperamide (IMODIUM) capsule 2-4 mg  2-4 mg Oral PRN Kerry Hough, PA-C      . magnesium hydroxide (MILK OF MAGNESIA) suspension 30 mL  30 mL Oral Daily PRN Kerry Hough, PA-C      . methocarbamol (ROBAXIN) tablet 500 mg  500 mg Oral Q8H PRN Rachael Fee, MD      . multivitamin with minerals tablet 1 tablet  1 tablet Oral Daily Kerry Hough, PA-C   1 tablet at 05/21/12 4540  . naproxen (NAPROSYN) tablet 500 mg  500 mg Oral BID PRN Rachael Fee, MD      . nicotine (NICODERM CQ - dosed in mg/24 hours) patch 14 mg  14 mg Transdermal Q0600 Kerry Hough, PA-C   14 mg at 05/21/12 9811  . ondansetron (ZOFRAN-ODT) disintegrating tablet 4 mg  4 mg Oral Q6H PRN Kerry Hough, PA-C      . QUEtiapine (SEROQUEL) tablet 50 mg  50 mg Oral TID Rachael Fee, MD   50 mg at 05/21/12 1159  . thiamine (B-1) injection 100 mg  100 mg Intramuscular Once [employer], PA-C      . thiamine (VITAMIN B-1) tablet 100 mg  100 mg Oral Daily Kerry Hough, PA-C   100 mg  at 05/21/12 9147  . traZODone (DESYREL) tablet 50 mg  50 mg Oral QHS,MR X 1 Kerry Hough, PA-C   50 mg at 05/20/12 2138    Lab Results: No results found for this or any previous visit (from the past 48 hour(s)).  Physical Findings: AIMS: Facial and Oral Movements Muscles of Facial Expression: None, normal Lips and Perioral Area: None, normal Jaw: None, normal Tongue: None, normal,Extremity Movements Upper (arms, wrists, hands, fingers): None, normal Lower (legs, knees, ankles, toes): None, normal, Trunk Movements Neck, shoulders, hips: None, normal, Overall Severity Severity of abnormal movements (highest score from questions above): None, normal Incapacitation due to abnormal movements: None, normal Patient's awareness of abnormal movements (rate only patient's report): No Awareness, Dental Status Current problems with teeth and/or dentures?: No Does patient usually wear dentures?: No  CIWA:  CIWA-Ar Total: 1 COWS:  COWS Total Score: 1  Treatment Plan Summary: Daily contact with patient to assess and evaluate symptoms and progress in treatment Medication management  Plan: Supportive approach/coping skills/relapse prevention           Will continue Equetro 200 mg BID           Have the Seroquel 50 mg TID   Medical Decision Making Problem Points:  Review of psycho-social stressors (1) Data Points:  Review of medication regiment & side effects (2) Review of new medications or change in dosage (2)  I certify that inpatient services furnished can reasonably be expected to improve the patient's condition.   Teodor Prater A 05/21/2012, 3:43 PM

## 2012-05-21 NOTE — Progress Notes (Signed)
Pt observed in the dayroom watching TV and talking with male peers.  Pt states he is feeling ok, but is still having anxiety r/t his withdrawals.  Pt has no noticeable tremors.  He denies SI/HI/AV.  He says he wants to try to get into [facility] and hopes that will work out.  He seems only half-hearted about getting and staying clean/sober.  He has attended some groups today.  He is more cooperative than yesterday.  He voices no needs/concerns at this time.  Pt makes his needs known to staff.  Support and encouragement offered.  Safety maintained with q15 minute checks.
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
## 2012-05-22 MED ORDER — CARBAMAZEPINE ER 200 MG PO CP12: 200.0000 mg | ORAL_CAPSULE | Freq: Two times a day (BID) | ORAL | Status: DC

## 2012-05-22 MED ORDER — TRAZODONE HCL 50 MG PO TABS: 100.0000 mg | ORAL_TABLET | Freq: Every evening | ORAL | Status: DC | PRN

## 2012-05-22 MED ORDER — NAPROXEN 500 MG PO TABS: 500.0000 mg | ORAL_TABLET | Freq: Two times a day (BID) | ORAL | Status: DC | PRN

## 2012-05-22 MED ORDER — LOPERAMIDE HCL 2 MG PO CAPS
2.0000 mg | ORAL_CAPSULE | ORAL | Status: DC | PRN
  Administered 2012-05-22: 2 mg via ORAL

## 2012-05-22 MED ORDER — TRAZODONE HCL 100 MG PO TABS
100.0000 mg | ORAL_TABLET | Freq: Every evening | ORAL | Status: DC | PRN
  Filled 2012-05-22 (×2): qty 28

## 2012-05-22 MED ORDER — TRAZODONE HCL 100 MG PO TABS
100.0000 mg | ORAL_TABLET | Freq: Every day | ORAL | Status: DC
  Filled 2012-05-22: qty 14

## 2012-05-22 MED ORDER — QUETIAPINE FUMARATE 50 MG PO TABS: 50.0000 mg | ORAL_TABLET | Freq: Three times a day (TID) | ORAL | Status: DC

## 2012-05-22 NOTE — Discharge Summary (Signed)
Physician Discharge Summary Note  Patient:  George Holloway is an 32 y.o., male MRN:  [id number] DOB:  [birth year] Patient phone:  [phone] (home)  Patient address:   [address] [place] [address],   Date of Admission:  05/19/2012 Date of Discharge: 05/21/2012  Reason for Admission:  Polysubstance detox, depression  Discharge Diagnoses: Active Problems:   Polysubstance dependence including opioid type drug, continuous use   Unspecified episodic mood disorder  Review of Systems  Constitutional: Negative.   HENT: Negative.   Eyes: Negative.   Respiratory: Negative.   Cardiovascular: Negative.   Gastrointestinal: Negative.   Genitourinary: Negative.   Musculoskeletal: Negative.   Skin: Negative.   Neurological: Negative.   Endo/Heme/Allergies: Negative.   Psychiatric/Behavioral: Positive for depression and substance abuse. The patient is nervous/anxious.    Axis Diagnosis:   AXIS I:  Alcohol Abuse, Anxiety Disorder NOS, Bipolar, Depressed and Substance Abuse AXIS II:  Deferred AXIS III:   Past Medical History  Diagnosis Date  . Bipolar 1 disorder   . Depression    AXIS IV:  economic problems, occupational problems, other psychosocial or environmental problems, problems related to social environment and problems with primary support group AXIS V:  61-70 mild symptoms  Level of Care:  [facility]  Hospital Course:  On admission:  George Holloway has been abusing pain pills regularly. Oxy 60 mg, Roxicodones 30 mg (3), and smoking "ice"every day. Three weeks ago started using crack cocaine. Since then things have ben worst. States that he knew it was going to be a problem. Smocking almost every day. Reports long standing history of addiction "have not been a day sober since I was 32 Y/O" Thinks that the triggers for the way he is feeling now, is the the anniversary of his grandfathers death. He met his mother at grandfathers funeral 4 years ago this is stressful trying to have a  relationship with him. . Stressed out about the baby on the way and the relationship with the girl he is having the baby with (she is 26 and has had kids before that were removed.) States he did not know that about her. States he has a temper, has lost jobs because af that. Heavy Arboriculturist. Feels his life is out of control.  During hospitalization:  Medication managed--Librium detox protocol utilized to medically detox the patient successfully--accomplished.  Carbamazepine 200 mg BID for bipolar depression, Seroquel 50 mg TID for bipolar depression, and trazodone for sleep issues started.  Naproxen 500 mg BID for muscle aches from detox and Vistaril 25 mg every 6 hours PRN anxiety ordered.  George Holloway will continue his care at [facility] and is motivated by his 88 yo son and soon to be baby girl.  He plans to get a job after rehab and focus on positive things.  Patient denied suicidal/homicidal ideations and auditory/visual hallucinations, follow-up appointments encouraged to attend, outside support groups encouraged, Rx and 14 day supply of medications given.  George Holloway is mentally and physically stable for discharge.  Consults:  None  Significant Diagnostic Studies:  labs: Completed and reviewed,stable  Discharge Vitals:   Blood pressure 117/75, pulse 69, temperature 97.9 F (36.6 C), temperature source Oral, resp. rate 16, height 5' 9.5" (1.765 m), weight 78.926 kg (174 lb). Body mass index is 25.34 kg/(m^2). Lab Results:   No results found for this or any previous visit (from the past 72 hour(s)).  Physical Findings: AIMS: Facial and Oral Movements Muscles of Facial Expression: None, normal Lips and Perioral  Area: None, normal Jaw: None, normal Tongue: None, normal,Extremity Movements Upper (arms, wrists, hands, fingers): None, normal Lower (legs, knees, ankles, toes): None, normal, Trunk Movements Neck, shoulders, hips: None, normal, Overall Severity Severity of abnormal movements (highest  score from questions above): None, normal Incapacitation due to abnormal movements: None, normal Patient's awareness of abnormal movements (rate only patient's report): No Awareness, Dental Status Current problems with teeth and/or dentures?: No Does patient usually wear dentures?: No  CIWA:  CIWA-Ar Total: 0 COWS:  COWS Total Score: 1  Psychiatric Specialty Exam: See Psychiatric Specialty Exam and Suicide Risk Assessment completed by Attending Physician prior to discharge.  Discharge destination:  [facility]  Is patient on multiple antipsychotic therapies at discharge:  No   Has Patient had three or more failed trials of antipsychotic monotherapy by history:  No Recommended Plan for Multiple Antipsychotic Therapies:  N./A  Discharge Orders   Future Orders Complete By Expires     Activity as tolerated - No restrictions  As directed     Diet - low sodium heart healthy  As directed         Medication List    STOP taking these medications       acetaminophen 500 MG tablet  Commonly known as:  TYLENOL      TAKE these medications     Indication   carbamazepine 200 MG Cp12  Commonly known as:  EQUETRO  Take 1 capsule (200 mg total) by mouth 2 (two) times daily.   Indication:  Mood stability     naproxen 500 MG tablet  Commonly known as:  NAPROSYN  Take 1 tablet (500 mg total) by mouth 2 (two) times daily as needed (aching, pain, or discomfort).      QUEtiapine 50 MG tablet  Commonly known as:  SEROQUEL  Take 1 tablet (50 mg total) by mouth 3 (three) times daily.   Indication:  Depressive Phase of Manic-Depression     traZODone 50 MG tablet  Commonly known as:  DESYREL  Take 2 tablets (100 mg total) by mouth at bedtime and may repeat dose one time if needed.   Indication:  Trouble Sleeping           Follow-up Information   Follow up with [facility] On 05/22/2012. (Plan is to transfer to [facility] on Friday 5/9 after lunch)    Contact information:   [address] [place] [address] Ph [phone] FAX [phone]      Follow-up recommendations:  Activity:  As tolerated Diet:  Low-sodium heart healthy diet Follow up the relapse prevention plan Comments:  Patient will discharge to [facility] this afternoon.  Total Discharge Time:  Greater than 30 minutes.  SignedNanine Means, PMH-NP 05/22/2012, 11:18 AM

## 2012-05-22 NOTE — Progress Notes (Signed)
Patient ID: George Holloway, male   DOB: 06/20/1980, 32 y.o.   MRN: [id number] Patient discharged per physician order; patient denies SI/HI and A/V hallucinations; patient received copy of AVS and prescriptions, samples after it was reviewed; patient left the unit ambulatory after signed and verbally that he received all belongings; patient left with the representative form [facility]

## 2012-05-22 NOTE — Progress Notes (Signed)
D   Pt is pleasant on approach and reports minimal signs and symptoms of withdrawal   He attended group and has been visible on the unit A   Verbal support given  Medications administered and effectiveness monitored    Q 15 min checks R   Pt safe at present

## 2012-05-22 NOTE — Tx Team (Signed)
  Interdisciplinary Treatment Plan Update   Date Reviewed:  05/22/2012  Time Reviewed:  1:48 PM  Progress in Treatment:   Attending groups: Yes Participating in groups: Yes Taking medication as prescribed: Yes  Tolerating medication: Yes Family/Significant other contact made: Yes  Patient understands diagnosis: Yes  Discussing patient identified problems/goals with staff: Yes Medical problems stabilized or resolved: Yes Denies suicidal/homicidal ideation: Yes  In tx team Patient has not harmed self or others: Yes  For review of initial/current patient goals, please see plan of care.  Estimated Length of Stay:  Transfer to [facility] today  Reason for Continuation of Hospitalization:   New Problems/Goals identified:  N/A  Discharge Plan or Barriers:     Additional Comments:  Attendees:  Signature: Geoffery Lyons, MD 05/22/2012 1:48 PM   Signature: Richelle Ito, LCSW 05/22/2012 1:48 PM  Signature: Tera Helper, NP  05/22/2012 1:48 PM  Signature: 05/22/2012 1:48 PM  Signature:  05/22/2012 1:48 PM  Signature:  05/22/2012 1:48 PM  Signature:   05/22/2012 1:48 PM  Signature:    Signature:    Signature:    Signature:    Signature:    Signature:      Scribe for Treatment Team:   Richelle Ito, LCSW  05/22/2012 1:48 PM

## 2012-05-22 NOTE — [facility] Suicide Risk Assessment (Signed)
Suicide Risk Assessment  Discharge Assessment     Demographic Factors:  Male and Caucasian  Mental Status Per Nursing Assessment::   On Admission:  Suicidal ideation indicated by patient  Current Mental Status by Physician: In full contact with reality. There are no suicidal ideas, plans or intent. He is motivated to pursue rehab. He will go to [facility]. After [facility], he will go to [facility] and pursue outpatient treatment.    Loss Factors: Decline in physical health and Financial problems/change in socioeconomic status  Historical Factors: NA  Risk Reduction Factors:   Sense of responsibility to family, Employed, Living with another person, especially a relative and Positive social support  Continued Clinical Symptoms:  Alcohol/Substance Abuse/Dependencies, Mood Disorder  Cognitive Features That Contribute To Risk:  Thought constriction (tunnel vision)    Suicide Risk:  Minimal: No identifiable suicidal ideation.  Patients presenting with no risk factors but with morbid ruminations; may be classified as minimal risk based on the severity of the depressive symptoms  Discharge Diagnoses:   AXIS I:  Polysubstance Dependence, Mood Disorder NOS AXIS II:  Deferred AXIS III:   Past Medical History  Diagnosis Date  . Bipolar 1 disorder   . Depression    AXIS IV:  other psychosocial or environmental problems AXIS V:  61-70 mild symptoms  Plan Of Care/Follow-up recommendations:  Activity:  as tolerated Diet:  regular [facility], then [facility]  Is patient on multiple antipsychotic therapies at discharge:  No   Has Patient had three or more failed trials of antipsychotic monotherapy by history:  No  Recommended Plan for Multiple Antipsychotic Therapies: N/A   George Holloway A 05/22/2012, 11:46 AM

## 2012-05-22 NOTE — Progress Notes (Signed)
[facility] Adult Case Management Discharge Plan :  Will you be returning to the same living situation after discharge: No. At discharge, do you have transportation home?:Yes,  [facility] Do you have the ability to pay for your medications:Yes,  Sent with supply  Release of information consent forms completed and in the chart;  Patient's signature needed at discharge.  Patient to Follow up at: Follow-up Information   Follow up with [facility] On 05/22/2012. (They will pick you up at 2:30PM)    Contact information:   [address] [place] [address] Ph [phone] Valinda Hoar [phone]      Patient denies SI/HI:   Yes,  yes    Safety Planning and Suicide Prevention discussed:  Yes,  yes  Ida Rogue 05/22/2012, 1:42 PM

## 2012-05-26 NOTE — Progress Notes (Signed)
Patient Discharge Instructions:  After Visit Summary (AVS):   Faxed to:  05/26/12 Discharge Summary Note:   Faxed to:  05/26/12 Psychiatric Admission Assessment Note:   Faxed to:  05/26/12 Suicide Risk Assessment - Discharge Assessment:   Faxed to:  05/26/12 Faxed/Sent to the Next Level Care provider:  05/26/12 Faxed to [facility] @ [phone]  Jerelene Redden, 05/26/2012, 4:08 PM

## 2013-05-20 ENCOUNTER — Encounter (HOSPITAL_COMMUNITY): Payer: Self-pay | Admitting: Emergency Medicine

## 2013-05-20 ENCOUNTER — Emergency Department (HOSPITAL_COMMUNITY)
Admission: EM | Admit: 2013-05-20 | Discharge: 2013-05-20 | Disposition: A | Discharge status: Home / Self Care | Payer: Medicaid Other | Attending: Emergency Medicine | Admitting: Emergency Medicine

## 2013-05-20 DIAGNOSIS — M549 Dorsalgia, unspecified: Secondary | ICD-10-CM | POA: Insufficient documentation

## 2013-05-20 DIAGNOSIS — F319 Bipolar disorder, unspecified: Secondary | ICD-10-CM | POA: Insufficient documentation

## 2013-05-20 DIAGNOSIS — Z79899 Other long term (current) drug therapy: Secondary | ICD-10-CM | POA: Insufficient documentation

## 2013-05-20 DIAGNOSIS — F17200 Nicotine dependence, unspecified, uncomplicated: Secondary | ICD-10-CM | POA: Insufficient documentation

## 2013-05-20 DIAGNOSIS — F1921 Other psychoactive substance dependence, in remission: Secondary | ICD-10-CM

## 2013-05-20 DIAGNOSIS — G8929 Other chronic pain: Secondary | ICD-10-CM | POA: Insufficient documentation

## 2013-05-20 MED ORDER — IBUPROFEN 800 MG PO TABS: 800.0000 mg | ORAL_TABLET | Freq: Three times a day (TID) | ORAL | Status: DC

## 2013-05-20 NOTE — Discharge Planning (Signed)
[facility] Community Liaison  Spoke to patient about primary care resources and establishing care with a provider. Patient was given the orange card application and resource guide. Patient was also given my contact information for future questions or concerns. No other needs expressed at this time

## 2013-05-20 NOTE — ED Provider Notes (Signed)
CSN: [id number]     Arrival date & time 05/20/13  1116 History   None   This chart was scribed for Mellody DrownLauren Domanic Matusek PA-C, a non-physician practitioner working with Laray AngerKathleen M McManus, DO by Lewanda RifeAlexandra Hurtado, ED Scribe. This patient was seen in room TR11C/TR11C and the patient's care was started at 12:29 PM      Chief Complaint  Patient presents with  . Hip Pain     (Consider location/radiation/quality/duration/timing/severity/associated sxs/prior Treatment) The history is provided by the patient. No language interpreter was used.   HPI Comments: Toy CareJames M Meunier is a 33 y.o. male who presents to the Emergency Department complaining of constant left hip pain onset chronic "from riding bulls for years". Describes pain as gradually worsening in severity and radiating down left leg. Reports over 10 year history of pain.  Reports trying Tylenol with no relief of symptoms. Reports pain is exacerbated by touch and movement. Denies associated recent injury, fever, numbness, and fall. Pt states "I am here for pain pills and that is all I need".   Past Medical History  Diagnosis Date  . Bipolar 1 disorder   . Depression    History reviewed. No pertinent past surgical history. No family history on file. History  Substance Use Topics  . Smoking status: Current Every Day Smoker -- 1.00 packs/day    Types: Cigarettes  . Smokeless tobacco: Not on file  . Alcohol Use: 1.2 oz/week    2 Cans of beer per week    Review of Systems  Constitutional: Negative for fever and chills.  Gastrointestinal: Negative for abdominal pain.  Genitourinary: Negative for dysuria, enuresis and difficulty urinating.  Musculoskeletal: Positive for arthralgias and back pain. Negative for joint swelling, neck pain and neck stiffness.  Skin: Negative for color change and wound.  Neurological: Negative for weakness and numbness.      Allergies  Tramadol  Home Medications   Prior to Admission medications    Medication Sig Start Date End Date Taking? Authorizing Provider  Carbamazepine (EQUETRO) 200 MG CP12 Take 1 capsule (200 mg total) by mouth 2 (two) times daily. 05/22/12   Nanine MeansJamison Lord, NP  naproxen (NAPROSYN) 500 MG tablet Take 1 tablet (500 mg total) by mouth 2 (two) times daily as needed (aching, pain, or discomfort). 05/22/12   Nanine MeansJamison Lord, NP  QUEtiapine (SEROQUEL) 50 MG tablet Take 1 tablet (50 mg total) by mouth 3 (three) times daily. 05/22/12   Nanine MeansJamison Lord, NP  traZODone (DESYREL) 50 MG tablet Take 2 tablets (100 mg total) by mouth at bedtime and may repeat dose one time if needed. 05/22/12   Nanine MeansJamison Lord, NP   BP 141/114  Pulse 96  Temp(Src) 98.2 F (36.8 C) (Oral)  Resp 20  Wt 184 lb (83.462 kg)  SpO2 97% Physical Exam  Nursing note and vitals reviewed. Constitutional: He is oriented to person, place, and time. Vital signs are normal. He appears well-developed and well-nourished. He is active and cooperative.  Non-toxic appearance. He does not have a sickly appearance. He does not appear ill. No distress.  HENT:  Head: Normocephalic and atraumatic.  Eyes: EOM are normal.  Neck: Neck supple. No tracheal deviation present.  Cardiovascular: Normal rate.   Pulmonary/Chest: Effort normal. No respiratory distress.  Musculoskeletal: Normal range of motion. He exhibits tenderness.       Left hip: He exhibits tenderness. He exhibits normal range of motion, normal strength, no swelling, no crepitus and no deformity.  Legs: Palpation of left SI joint recreates the lower extremity discomfort. Good and equal strength and sensation to bilateral lower extremities.   Neurological: He is alert and oriented to person, place, and time.  Skin: Skin is warm and dry. He is not diaphoretic.  Psychiatric: He has a normal mood and affect. His behavior is normal.    ED Course  Procedures (including critical care time) COORDINATION OF CARE:  Nursing notes reviewed. Vital signs reviewed. Initial pt  interview and examination performed.   Filed Vitals:   05/20/13 1122  BP: 141/114  Pulse: 96  Temp: 98.2 F (36.8 C)  TempSrc: Oral  Resp: 20  Weight: 184 lb (83.462 kg)  SpO2: 97%   MDM   Final diagnoses:  Chronic pain  History of drug dependence  Pt has a hx of narcotic abuse and dependence. Chronic pain. No recent injury or red flags on exam.  Discussed Ibuprofen instead of tylenol use and appropriate amount of medication not to OD on OTC medication. Pt then reports "I want pain pills, that's why I'm here".  Discussed with his history of narcotic abuse and misuse I would not prescribe him narcotics and to follow up with an Orthopedic, PCP, or pain specialist for narcotic medication. Discussed lab results, imaging results, and treatment plan with the patient. Return precautions given. Reports understanding and no other concerns at this time.  Patient is stable for discharge at this time.  Meds given in ED:  Medications - No data to display  Discharge Medication List as of 05/20/2013 12:47 PM    START taking these medications   Details  ibuprofen (ADVIL,MOTRIN) 800 MG tablet Take 1 tablet (800 mg total) by mouth 3 (three) times daily., Starting 05/20/2013, Until Discontinued, Print       I personally performed the services described in this documentation, which was scribed in my presence. The recorded information has been reviewed and is accurate.      Clabe SealLauren M Quinton Voth, PA-C 05/22/13 1140

## 2013-05-20 NOTE — Discharge Instructions (Signed)
Call an Orthopedic specialist for an appointment.  Call a pain specialist for further evaluation of your chronic pain.  Call for a follow up appointment with a Family or Primary Care Provider.  Return if Symptoms worsen.   Take medication as prescribed.

## 2013-05-20 NOTE — ED Notes (Addendum)
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

## 2013-05-22 NOTE — ED Provider Notes (Signed)
Medical screening examination/treatment/procedure(s) were performed by non-physician practitioner and as supervising physician I was immediately available for consultation/collaboration.   EKG Interpretation None        Laray AngerKathleen M Machelle Raybon, DO 05/22/13 1447

## 2014-01-16 ENCOUNTER — Encounter (HOSPITAL_COMMUNITY): Payer: Self-pay | Admitting: Emergency Medicine

## 2014-01-16 ENCOUNTER — Emergency Department (HOSPITAL_COMMUNITY)
Admission: EM | Admit: 2014-01-16 | Discharge: 2014-01-16 | Disposition: A | Discharge status: Home / Self Care | Payer: Medicaid Other | Attending: Emergency Medicine | Admitting: Emergency Medicine

## 2014-01-16 DIAGNOSIS — Z720 Tobacco use: Secondary | ICD-10-CM | POA: Insufficient documentation

## 2014-01-16 DIAGNOSIS — Z8659 Personal history of other mental and behavioral disorders: Secondary | ICD-10-CM | POA: Insufficient documentation

## 2014-01-16 DIAGNOSIS — R109 Unspecified abdominal pain: Secondary | ICD-10-CM | POA: Insufficient documentation

## 2014-01-16 DIAGNOSIS — Z87448 Personal history of other diseases of urinary system: Secondary | ICD-10-CM | POA: Insufficient documentation

## 2014-01-16 DIAGNOSIS — Z87442 Personal history of urinary calculi: Secondary | ICD-10-CM | POA: Insufficient documentation

## 2014-01-16 DIAGNOSIS — M545 Low back pain: Secondary | ICD-10-CM | POA: Insufficient documentation

## 2014-01-16 HISTORY — DX: Disorder of kidney and ureter, unspecified: N28.9

## 2014-01-16 LAB — COMPREHENSIVE METABOLIC PANEL
ALT: 18 U/L (ref 0–53)
AST: 16 U/L (ref 0–37)
Albumin: 4.8 g/dL (ref 3.5–5.2)
Alkaline Phosphatase: 71 U/L (ref 39–117)
Anion gap: 7 (ref 5–15)
BUN: 10 mg/dL (ref 6–23)
CALCIUM: 9.2 mg/dL (ref 8.4–10.5)
CO2: 28 mmol/L (ref 19–32)
CREATININE: 1.23 mg/dL (ref 0.50–1.35)
Chloride: 103 mEq/L (ref 96–112)
GFR, EST AFRICAN AMERICAN: 87 mL/min — AB (ref >90)
GFR, EST NON AFRICAN AMERICAN: 75 mL/min — AB (ref >90)
GLUCOSE: 93 mg/dL (ref 70–99)
Potassium: 4.2 mmol/L (ref 3.5–5.1)
Sodium: 138 mmol/L (ref 135–145)
Total Bilirubin: 0.5 mg/dL (ref 0.3–1.2)
Total Protein: 7.7 g/dL (ref 6.0–8.3)

## 2014-01-16 LAB — CBC WITH DIFFERENTIAL/PLATELET
BASOS ABS: 0 10*3/uL (ref 0.0–0.1)
BASOS PCT: 0 % (ref 0–1)
EOS ABS: 0.4 10*3/uL (ref 0.0–0.7)
EOS PCT: 5 % (ref 0–5)
HEMATOCRIT: 43.2 % (ref 39.0–52.0)
HEMOGLOBIN: 14.1 g/dL (ref 13.0–17.0)
Lymphocytes Relative: 48 % — ABNORMAL HIGH (ref 12–46)
Lymphs Abs: 4.4 10*3/uL — ABNORMAL HIGH (ref 0.7–4.0)
MCH: 32.3 pg (ref 26.0–34.0)
MCHC: 32.6 g/dL (ref 30.0–36.0)
MCV: 98.9 fL (ref 78.0–100.0)
MONO ABS: 0.4 10*3/uL (ref 0.1–1.0)
MONOS PCT: 5 % (ref 3–12)
NEUTROS ABS: 3.9 10*3/uL (ref 1.7–7.7)
Neutrophils Relative %: 42 % — ABNORMAL LOW (ref 43–77)
Platelets: 295 10*3/uL (ref 150–400)
RBC: 4.37 MIL/uL (ref 4.22–5.81)
RDW: 12.9 % (ref 11.5–15.5)
WBC: 9.2 10*3/uL (ref 4.0–10.5)

## 2014-01-16 LAB — URINALYSIS, ROUTINE W REFLEX MICROSCOPIC
BILIRUBIN URINE: NEGATIVE
GLUCOSE, UA: NEGATIVE mg/dL
Hgb urine dipstick: NEGATIVE
KETONES UR: NEGATIVE mg/dL
LEUKOCYTES UA: NEGATIVE
Nitrite: NEGATIVE
PROTEIN: NEGATIVE mg/dL
Specific Gravity, Urine: 1.008 (ref 1.005–1.030)
Urobilinogen, UA: 0.2 mg/dL (ref 0.0–1.0)
pH: 7 (ref 5.0–8.0)

## 2014-01-16 LAB — LIPASE, BLOOD: LIPASE: 18 U/L (ref 11–59)

## 2014-01-16 MED ORDER — OXYCODONE-ACETAMINOPHEN 5-325 MG PO TABS
1.0000 | ORAL_TABLET | Freq: Once | ORAL | Status: AC
  Administered 2014-01-16: 1 via ORAL
  Filled 2014-01-16: qty 1

## 2014-01-16 MED ORDER — OXYCODONE-ACETAMINOPHEN 5-325 MG PO TABS: 1.0000 | ORAL_TABLET | Freq: Four times a day (QID) | ORAL | Status: DC | PRN

## 2014-01-16 MED ORDER — CYCLOBENZAPRINE HCL 10 MG PO TABS: 10.0000 mg | ORAL_TABLET | Freq: Two times a day (BID) | ORAL | Status: DC | PRN

## 2014-01-16 NOTE — ED Notes (Signed)
Hx as per triage note.  He is very nice, and requested non-IV pain med.   Order rec'd. From Dr. Madilyn Hook for same, which I give, and for which he thanks me.

## 2014-01-16 NOTE — Discharge Instructions (Signed)
Flank Pain °Flank pain refers to pain that is located on the side of the body between the upper abdomen and the back. The pain may occur over a short period of time (acute) or may be long-term or reoccurring (chronic). It may be mild or severe. Flank pain can be caused by many things. °CAUSES  °Some of the more common causes of flank pain include: °· Muscle strains.   °· Muscle spasms.   °· A disease of your spine (vertebral disk disease).   °· A lung infection (pneumonia).   °· Fluid around your lungs (pulmonary edema).   °· A kidney infection.   °· Kidney stones.   °· A very painful skin rash caused by the chickenpox virus (shingles).   °· Gallbladder disease.   °HOME CARE INSTRUCTIONS  °Home care will depend on the cause of your pain. In general, °· Rest as directed by your caregiver. °· Drink enough fluids to keep your urine clear or pale yellow. °· Only take over-the-counter or prescription medicines as directed by your caregiver. Some medicines may help relieve the pain. °· Tell your caregiver about any changes in your pain. °· Follow up with your caregiver as directed. °SEEK IMMEDIATE MEDICAL CARE IF:  °· Your pain is not controlled with medicine.   °· You have new or worsening symptoms. °· Your pain increases.   °· You have abdominal pain.   °· You have shortness of breath.   °· You have persistent nausea or vomiting.   °· You have swelling in your abdomen.   °· You feel faint or pass out.   °· You have blood in your urine. °· You have a fever or persistent symptoms for more than 2-3 days. °· You have a fever and your symptoms suddenly get worse. °MAKE SURE YOU:  °· Understand these instructions. °· Will watch your condition. °· Will get help right away if you are not doing well or get worse. °Document Released: 02/21/2005 Document Revised: 09/25/2011 Document Reviewed: 08/15/2011 °ExitCare® Patient Information ©2015 [employer]. This information is not intended to replace advice given to you by your  health care provider. Make sure you discuss any questions you have with your health care provider. ° °

## 2014-01-16 NOTE — ED Provider Notes (Signed)
CSN: [id number]     Arrival date & time 01/16/14  1600 History   First MD Initiated Contact with Patient 01/16/14 1852     Chief Complaint  Patient presents with  . Flank Pain     Patient is a 34 y.o. male presenting with flank pain. The history is provided by the patient. No language interpreter was used.  Flank Pain   Mr. Schoeppner presents for evaluation of right flank pain. He developed right-sided pain last night it feels like a tightness, squeezing in the right lower back. He works as a Nutritional therapist, he has no history of injury that he can think of. He denies any numbness, weakness, fevers, abdominal pain, vomiting, hematuria, dysuria. He reports that sometimes he has to push to urinate but otherwise no change in his urination. He has a history of kidney stones, he never needed a procedure to pass these, and he is not sure if this feels like prior kidney stones or not. Pain is worse with movements he feels better when he is sitting still. Symptoms are moderate, constant, worsening.  Past Medical History  Diagnosis Date  . Bipolar 1 disorder   . Depression   . Renal disorder     kidney infection   History reviewed. No pertinent past surgical history. History reviewed. No pertinent family history. History  Substance Use Topics  . Smoking status: Current Every Day Smoker -- 0.50 packs/day    Types: Cigarettes  . Smokeless tobacco: Never Used  . Alcohol Use: No     Comment: Quit feb 2015    Review of Systems  Genitourinary: Positive for flank pain.  All other systems reviewed and are negative.     Allergies  Tramadol and Citrus  Home Medications   Prior to Admission medications   Medication Sig Start Date End Date Taking? Authorizing Provider  ibuprofen (ADVIL,MOTRIN) 100 MG chewable tablet Chew 600 mg by mouth every 8 (eight) hours as needed.   Yes Historical Provider, MD  ibuprofen (ADVIL,MOTRIN) 800 MG tablet Take 1 tablet (800 mg total) by mouth 3 (three) times  daily. Patient not taking: Reported on 01/16/2014 05/20/13   Mellody Drown, PA-C   BP 148/72 mmHg  Pulse 81  Temp(Src) 97.9 F (36.6 C) (Oral)  Resp 16  Ht 5' 8.5" (1.74 m)  Wt 175 lb (79.379 kg)  BMI 26.22 kg/m2  SpO2 100% Physical Exam  Constitutional: He is oriented to person, place, and time. He appears well-developed and well-nourished.  HENT:  Head: Normocephalic and atraumatic.  Cardiovascular: Normal rate and regular rhythm.   No murmur heard. Pulmonary/Chest: Effort normal and breath sounds normal. No respiratory distress.  Abdominal: Soft. There is no tenderness. There is no rebound and no guarding.  Musculoskeletal: He exhibits no edema or tenderness.  Mild right lateral lower back tenderness, no bony lumbar tenderness.  Neurological: He is alert and oriented to person, place, and time.  5 out of 5 strength in bilateral lower extremities.  Skin: Skin is warm and dry.  Psychiatric: He has a normal mood and affect. His behavior is normal.  Nursing note and vitals reviewed.   ED Course  Procedures (including critical care time) Labs Review Labs Reviewed  CBC WITH DIFFERENTIAL - Abnormal; Notable for the following:    Neutrophils Relative % 42 (*)    Lymphocytes Relative 48 (*)    Lymphs Abs 4.4 (*)    All other components within normal limits  COMPREHENSIVE METABOLIC PANEL - Abnormal; Notable for the  following:    GFR calc non Af Amer 75 (*)    GFR calc Af Amer 87 (*)    All other components within normal limits  LIPASE, BLOOD  URINALYSIS, ROUTINE W REFLEX MICROSCOPIC    Imaging Review No results found.   EKG Interpretation None      MDM   Final diagnoses:  Acute right flank pain    Patient here for evaluation of right flank pain, has history of renal colic, the current history and presentation is not consistent with renal colic. UA is not consistent with acute UTI. BMP without any renal insufficiency. Discussed with patient likely musculoskeletal pain  we'll treat with pain medication and muscle relaxants with close return precautions.    Tilden Fossa, MD 01/16/14 2348

## 2014-01-16 NOTE — ED Notes (Signed)
Awake. Verbally responsive. A/O x4. Resp even and unlabored. No audible adventitious breath sounds noted. ABC's intact. NAD noted. 

## 2014-01-16 NOTE — ED Notes (Addendum)
Pt reports that he has been having R sided flank pain since 1300 yesterday. Pt reports constant pain. Pt has been having urinary urgency with oliguria. Pt denies four odor. Reports urine is clear. Reports UTI in the past that felt similar to this. Pt reports taking Vicodin and Advil without relief.

## 2014-03-03 ENCOUNTER — Encounter (HOSPITAL_COMMUNITY): Payer: Self-pay

## 2014-03-03 ENCOUNTER — Emergency Department (HOSPITAL_COMMUNITY)
Admission: EM | Admit: 2014-03-03 | Discharge: 2014-03-03 | Disposition: A | Discharge status: Home / Self Care | Payer: 59 | Attending: Emergency Medicine | Admitting: Emergency Medicine

## 2014-03-03 DIAGNOSIS — Z87448 Personal history of other diseases of urinary system: Secondary | ICD-10-CM | POA: Diagnosis not present

## 2014-03-03 DIAGNOSIS — R21 Rash and other nonspecific skin eruption: Secondary | ICD-10-CM | POA: Diagnosis present

## 2014-03-03 DIAGNOSIS — Z720 Tobacco use: Secondary | ICD-10-CM | POA: Insufficient documentation

## 2014-03-03 DIAGNOSIS — Z8659 Personal history of other mental and behavioral disorders: Secondary | ICD-10-CM | POA: Insufficient documentation

## 2014-03-03 DIAGNOSIS — L259 Unspecified contact dermatitis, unspecified cause: Secondary | ICD-10-CM | POA: Diagnosis not present

## 2014-03-03 DIAGNOSIS — Z791 Long term (current) use of non-steroidal anti-inflammatories (NSAID): Secondary | ICD-10-CM | POA: Insufficient documentation

## 2014-03-03 MED ORDER — FAMOTIDINE 20 MG PO TABS: 20.0000 mg | ORAL_TABLET | Freq: Two times a day (BID) | ORAL | Status: AC

## 2014-03-03 MED ORDER — PREDNISONE 10 MG PO TABS: ORAL_TABLET | ORAL | Status: AC

## 2014-03-03 NOTE — ED Provider Notes (Signed)
CSN: [id number]     Arrival date & time 03/03/14  0913 History   First MD Initiated Contact with Patient 03/03/14 0914     Chief Complaint  Patient presents with  . Rash      HPI Presents to ER with rash over the past 24 hours, spreading from his bilateral legs to his abdomen. No rash of his bag. No hx of contact dermatitis. 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 Also used a new caustic calk and thinks it could be related to this. No difficulty breathing or swallowing. No other complaints.    Past Medical History  Diagnosis Date  . Bipolar 1 disorder   . Depression   . Renal disorder     kidney infection   History reviewed. No pertinent past surgical history. No family history on file. History  Substance Use Topics  . Smoking status: Current Every Day Smoker -- 0.50 packs/day    Types: Cigarettes  . Smokeless tobacco: Never Used  . Alcohol Use: No     Comment: Quit feb 2015    Review of Systems  All other systems reviewed and are negative.     Allergies  Hydrocodone; Tramadol; and Citrus  Home Medications   Prior to Admission medications   Medication Sig Start Date End Date Taking? Authorizing Provider  cyclobenzaprine (FLEXERIL) 10 MG tablet Take 1 tablet (10 mg total) by mouth 2 (two) times daily as needed for muscle spasms. 01/16/14   Tilden FossaElizabeth Rees, MD  ibuprofen (ADVIL,MOTRIN) 100 MG chewable tablet Chew 600 mg by mouth every 8 (eight) hours as needed.    Historical Provider, MD  ibuprofen (ADVIL,MOTRIN) 800 MG tablet Take 1 tablet (800 mg total) by mouth 3 (three) times daily. Patient not taking: Reported on 01/16/2014 05/20/13   Mellody DrownLauren Parker, PA-C  oxyCODONE-acetaminophen (PERCOCET/ROXICET) 5-325 MG per tablet Take 1 tablet by mouth every 6 (six) hours as needed for moderate pain or severe pain. 01/16/14   Tilden FossaElizabeth Rees, MD   BP 134/73 mmHg  Pulse 80  Temp(Src) 98.1 F (36.7 C) (Oral)  Resp 18  SpO2 99% Physical Exam  Constitutional:  He is oriented to person, place, and time. He appears well-developed and well-nourished.  HENT:  Head: Normocephalic and atraumatic.  Eyes: EOM are normal.  Neck: Normal range of motion.  Cardiovascular: Normal rate.   Pulmonary/Chest: No respiratory distress.  Abdominal: He exhibits no distension.  Musculoskeletal: Normal range of motion.  Neurological: He is alert and oriented to person, place, and time.  Skin:  Spreading macular papular rash of the bilateral anterior thigh and stomach  Psychiatric: He has a normal mood and affect. Judgment normal.  Nursing note and vitals reviewed.   ED Course  Procedures (including critical care time) Labs Review Labs Reviewed - No data to display  Imaging Review No results found.   EKG Interpretation None      MDM   Final diagnoses:  Contact dermatitis    Consistent with contact dermatitis. Will initiate benadryl, topical steroids, pepcid and prednisone    Lyanne CoKevin M Evans Levee, MD 03/03/14 [phone]

## 2014-03-03 NOTE — ED Notes (Signed)
Pt presents with c/o rash that started yesterday. Pt reports the rash is on his legs, arms, stomach, and believes it is spreading to his face. Pt is in no respiratory distress, talking in complete sentences, ambulatory to room. Pt says he used some fire caulk at work earlier this week and believes that may be the cause.

## 2014-03-03 NOTE — Discharge Instructions (Signed)

## 2014-09-13 ENCOUNTER — Encounter (HOSPITAL_COMMUNITY): Payer: Self-pay | Admitting: Emergency Medicine

## 2014-09-13 ENCOUNTER — Emergency Department (HOSPITAL_COMMUNITY)
Admission: EM | Admit: 2014-09-13 | Discharge: 2014-09-13 | Disposition: A | Discharge status: Home / Self Care | Payer: 59 | Attending: Emergency Medicine | Admitting: Emergency Medicine

## 2014-09-13 DIAGNOSIS — Z87448 Personal history of other diseases of urinary system: Secondary | ICD-10-CM | POA: Insufficient documentation

## 2014-09-13 DIAGNOSIS — Z8659 Personal history of other mental and behavioral disorders: Secondary | ICD-10-CM | POA: Insufficient documentation

## 2014-09-13 DIAGNOSIS — B029 Zoster without complications: Secondary | ICD-10-CM

## 2014-09-13 DIAGNOSIS — Z720 Tobacco use: Secondary | ICD-10-CM | POA: Insufficient documentation

## 2014-09-13 MED ORDER — ACYCLOVIR 800 MG PO TABS: 800.0000 mg | ORAL_TABLET | Freq: Every day | ORAL | Status: AC

## 2014-09-13 MED ORDER — OXYCODONE-ACETAMINOPHEN 5-325 MG PO TABS
1.0000 | ORAL_TABLET | Freq: Once | ORAL | Status: AC
  Administered 2014-09-13: 1 via ORAL
  Filled 2014-09-13: qty 1

## 2014-09-13 MED ORDER — OXYCODONE-ACETAMINOPHEN 5-325 MG PO TABS: 1.0000 | ORAL_TABLET | Freq: Four times a day (QID) | ORAL | Status: AC | PRN

## 2014-09-13 NOTE — ED Provider Notes (Signed)
CSN: [id number]     Arrival date & time 09/13/14  1829 History   First MD Initiated Contact with Patient 09/13/14 1849     Chief Complaint  Patient presents with  . Rash     Patient is a 34 y.o. male presenting with rash. The history is provided by the patient. No language interpreter was used.  Rash  George Holloway presents for evaluation of rash. He's had 2 days of burning and painful rash to his right upper back that wraps around to his right nipple. It is burning in nature. The pain is intense. He denies any fevers. He does have some pain in his muscles when he moves his chest and right arm. He's had 3 episodes if she was previously. He denies any medical history. Symptoms are severe, constant, worsening.  Past Medical History  Diagnosis Date  . Bipolar 1 disorder   . Depression   . Renal disorder     kidney infection   History reviewed. No pertinent past surgical history. History reviewed. No pertinent family history. Social History  Substance Use Topics  . Smoking status: Current Every Day Smoker -- 0.50 packs/day    Types: Cigarettes  . Smokeless tobacco: Never Used  . Alcohol Use: No     Comment: Quit feb 2015    Review of Systems  Skin: Positive for rash.  All other systems reviewed and are negative.     Allergies  Hydrocodone; Tramadol; and Citrus  Home Medications   Prior to Admission medications   Medication Sig Start Date End Date Taking? Authorizing Provider  Aspirin-Acetaminophen-Caffeine (GOODY HEADACHE PO) Take 1 each by mouth every 8 (eight) hours as needed (pain).   Yes Historical Provider, MD  famotidine (PEPCID) 20 MG tablet Take 1 tablet (20 mg total) by mouth 2 (two) times daily. Patient not taking: Reported on 09/13/2014 03/03/14   Azalia Bilis, MD  predniSONE (DELTASONE) 10 MG tablet  PO x 3 days,  PO x 3 days,  POx 2 days, 10 mgPO x 1 day Patient not taking: Reported on 09/13/2014 03/03/14   Azalia Bilis, MD   BP 125/77 mmHg  Pulse 85   Temp(Src) 97.9 F (36.6 C) (Oral)  Resp 16  SpO2 99% Physical Exam  Constitutional: He is oriented to person, place, and time. He appears well-developed and well-nourished.  HENT:  Head: Normocephalic and atraumatic.  Cardiovascular: Normal rate and regular rhythm.   Pulmonary/Chest: Effort normal. No respiratory distress.  Vesicular rash from right upper back wrapping around the axilla to just above the right nipple. The patient is surrounding cellulitis or evidence of abscess.  Neurological: He is alert and oriented to person, place, and time.  Skin: Skin is warm and dry.  Psychiatric: He has a normal mood and affect. His behavior is normal.  Nursing note and vitals reviewed.   ED Course  Procedures (including critical care time) Labs Review Labs Reviewed - No data to display  Imaging Review No results found. I have personally reviewed and evaluated these images and lab results as part of my medical decision-making.   EKG Interpretation None      MDM   Final diagnoses:  Shingles    Patient with history of shingles here for recurrent shingles. Patient is nontoxic appearing on examination with no evidence, getting features. Discussed with patient having care for shingles as well as return precautions and transmission precautions.  Tilden Fossa, MD 09/13/14 2024

## 2014-09-13 NOTE — ED Notes (Signed)
Pt states that he feels as if he has shingles. Has had it 3 times before. Area is on his R shoulder area coming around to his nipple. Alert and oriented.

## 2014-09-13 NOTE — ED Notes (Signed)
Education provided rd/t [employer].questions denied.Pt is a&ox4 and ambulatory

## 2014-09-13 NOTE — Discharge Instructions (Signed)

## 2015-01-20 ENCOUNTER — Encounter (HOSPITAL_COMMUNITY): Payer: Self-pay

## 2015-01-20 ENCOUNTER — Emergency Department (HOSPITAL_COMMUNITY)
Admission: EM | Admit: 2015-01-20 | Discharge: 2015-01-20 | Disposition: A | Discharge status: Home / Self Care | Payer: BLUE CROSS/BLUE SHIELD | Attending: Emergency Medicine | Admitting: Emergency Medicine

## 2015-01-20 ENCOUNTER — Emergency Department (HOSPITAL_COMMUNITY): Payer: BLUE CROSS/BLUE SHIELD

## 2015-01-20 DIAGNOSIS — M25552 Pain in left hip: Secondary | ICD-10-CM | POA: Diagnosis present

## 2015-01-20 DIAGNOSIS — F17210 Nicotine dependence, cigarettes, uncomplicated: Secondary | ICD-10-CM | POA: Diagnosis not present

## 2015-01-20 DIAGNOSIS — Z8659 Personal history of other mental and behavioral disorders: Secondary | ICD-10-CM | POA: Diagnosis not present

## 2015-01-20 DIAGNOSIS — Z87448 Personal history of other diseases of urinary system: Secondary | ICD-10-CM | POA: Insufficient documentation

## 2015-01-20 DIAGNOSIS — G8929 Other chronic pain: Secondary | ICD-10-CM | POA: Diagnosis not present

## 2015-01-20 DIAGNOSIS — Z79899 Other long term (current) drug therapy: Secondary | ICD-10-CM | POA: Diagnosis not present

## 2015-01-20 MED ORDER — KETOROLAC TROMETHAMINE 60 MG/2ML IM SOLN
60.0000 mg | Freq: Once | INTRAMUSCULAR | Status: AC
  Administered 2015-01-20: 60 mg via INTRAMUSCULAR
  Filled 2015-01-20: qty 2

## 2015-01-20 MED ORDER — NAPROXEN 500 MG PO TABS: 500.0000 mg | ORAL_TABLET | Freq: Two times a day (BID) | ORAL | Status: AC

## 2015-01-20 NOTE — Discharge Instructions (Signed)
Cryotherapy °Cryotherapy means treatment with cold. Ice or gel packs can be used to reduce both pain and swelling. Ice is the most helpful within the first 24 to 48 hours after an injury or flare-up from overusing a muscle or joint. Sprains, strains, spasms, burning pain, shooting pain, and aches can all be eased with ice. Ice can also be used when recovering from surgery. Ice is effective, has very few side effects, and is safe for most people to use. °PRECAUTIONS  °Ice is not a safe treatment option for people with: °· Raynaud phenomenon. This is a condition affecting small blood vessels in the extremities. Exposure to cold may cause your problems to return. °· Cold hypersensitivity. There are many forms of cold hypersensitivity, including: °¨ Cold urticaria. Red, itchy hives appear on the skin when the tissues begin to warm after being iced. °¨ Cold erythema. This is a red, itchy rash caused by exposure to cold. °¨ Cold hemoglobinuria. Red blood cells break down when the tissues begin to warm after being iced. The hemoglobin that carry oxygen are passed into the urine because they cannot combine with blood proteins fast enough. °· Numbness or altered sensitivity in the area being iced. °If you have any of the following conditions, do not use ice until you have discussed cryotherapy with your caregiver: °· Heart conditions, such as arrhythmia, angina, or chronic heart disease. °· High blood pressure. °· Healing wounds or open skin in the area being iced. °· Current infections. °· Rheumatoid arthritis. °· Poor circulation. °· Diabetes. °Ice slows the blood flow in the region it is applied. This is beneficial when trying to stop inflamed tissues from spreading irritating chemicals to surrounding tissues. However, if you expose your skin to cold temperatures for too long or without the proper protection, you can damage your skin or nerves. Watch for signs of skin damage due to cold. °HOME CARE INSTRUCTIONS °Follow  these tips to use ice and cold packs safely. °· Place a dry or damp towel between the ice and skin. A damp towel will cool the skin more quickly, so you may need to shorten the time that the ice is used. °· For a more rapid response, add gentle compression to the ice. °· Ice for no more than 10 to 20 minutes at a time. The bonier the area you are icing, the less time it will take to get the benefits of ice. °· Check your skin after 5 minutes to make sure there are no signs of a poor response to cold or skin damage. °· Rest 20 minutes or more between uses. °· Once your skin is numb, you can end your treatment. You can test numbness by very lightly touching your skin. The touch should be so light that you do not see the skin dimple from the pressure of your fingertip. When using ice, most people will feel these normal sensations in this order: cold, burning, aching, and numbness. °· Do not use ice on someone who cannot communicate their responses to pain, such as small children or people with dementia. °HOW TO MAKE AN ICE PACK °Ice packs are the most common way to use ice therapy. Other methods include ice massage, ice baths, and cryosprays. Muscle creams that cause a cold, tingly feeling do not offer the same benefits that ice offers and should not be used as a substitute unless recommended by your caregiver. °To make an ice pack, do one of the following: °· Place crushed ice or a   bag of frozen vegetables in a sealable plastic bag. Squeeze out the excess air. Place this bag inside another plastic bag. Slide the bag into a pillowcase or place a damp towel between your skin and the bag.  Mix 3 parts water with 1 part rubbing alcohol. Freeze the mixture in a sealable plastic bag. When you remove the mixture from the freezer, it will be slushy. Squeeze out the excess air. Place this bag inside another plastic bag. Slide the bag into a pillowcase or place a damp towel between your skin and the bag. SEEK MEDICAL CARE  IF:  You develop white spots on your skin. This may give the skin a blotchy (mottled) appearance.  Your skin turns blue or pale.  Your skin becomes waxy or hard.  Your swelling gets worse. MAKE SURE YOU:   Understand these instructions.  Will watch your condition.  Will get help right away if you are not doing well or get worse.   This information is not intended to replace advice given to you by your health care provider. Make sure you discuss any questions you have with your health care provider.   Document Released: 08/27/2010 Document Revised: 01/21/2014 Document Reviewed: 08/27/2010 Elsevier Interactive Patient Education 2016 [employer]  Hip Pain Your hip is the joint between your upper legs and your lower pelvis. The bones, cartilage, tendons, and muscles of your hip joint perform a lot of work each day supporting your body weight and allowing you to move around. Hip pain can range from a minor ache to severe pain in one or both of your hips. Pain may be felt on the inside of the hip joint near the groin, or the outside near the buttocks and upper thigh. You may have swelling or stiffness as well.  HOME CARE INSTRUCTIONS   Take medicines only as directed by your health care provider.  Apply ice to the injured area:  Put ice in a plastic bag.  Place a towel between your skin and the bag.  Leave the ice on for 15-20 minutes at a time, 3-4 times a day.  Keep your leg raised (elevated) when possible to lessen swelling.  Avoid activities that cause pain.  Follow specific exercises as directed by your health care provider.  Sleep with a pillow between your legs on your most comfortable side.  Record how often you have hip pain, the location of the pain, and what it feels like. SEEK MEDICAL CARE IF:   You are unable to put weight on your leg.  Your hip is red or swollen or very tender to touch.  Your pain or swelling continues or worsens after 1 week.  You  have increasing difficulty walking.  You have a fever. SEEK IMMEDIATE MEDICAL CARE IF:   You have fallen.  You have a sudden increase in pain and swelling in your hip. MAKE SURE YOU:   Understand these instructions.  Will watch your condition.  Will get help right away if you are not doing well or get worse.   This information is not intended to replace advice given to you by your health care provider. Make sure you discuss any questions you have with your health care provider.   Follow up with orthopedic provider for consultation and re-evaluation. Apply ice to affected area. Return to the ED if you experience chest pain, shortness of breath, numbness/tingling in your extremity.

## 2015-01-20 NOTE — ED Provider Notes (Signed)
CSN: [id number]     Arrival date & time 01/20/15  1250 History  By signing my name below, I, Tanda RockersMargaux Venter, attest that this documentation has been prepared under the direction and in the presence of AvayaSamantha Dowless, PA-C. Electronically Signed: Tanda RockersMargaux Venter, ED Scribe. 01/20/2015. 2:35 PM.   Chief Complaint  Patient presents with  . Hip Pain   The history is provided by the patient. No language interpreter was used.     HPI Comments: George Holloway is a 35 y.o. male who presents to the Emergency Department complaining of gradual onset, constant, left hip pain x 5-6 years, worsening in the past 2 weeks. 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 He has been followed up in the past for this pain and states that the providers would want to send him to a specialist but he did not have insurance and could not follow up. Pt states he can now feel his hip catching when he walks, which is new. He took Ibuprofen this morning without relief. Denies weakness, numbness, tingling, or any other associated symptoms.   Past Medical History  Diagnosis Date  . Bipolar 1 disorder (HCC)   . Depression   . Renal disorder     kidney infection   History reviewed. No pertinent past surgical history. History reviewed. No pertinent family history. Social History  Substance Use Topics  . Smoking status: Current Every Day Smoker -- 0.50 packs/day    Types: Cigarettes  . Smokeless tobacco: Never Used  . Alcohol Use: No     Comment: Quit feb 2015    Review of Systems  Musculoskeletal: Positive for arthralgias (Left hip).  Neurological: Negative for weakness and numbness.  All other systems reviewed and are negative.  Allergies  Hydrocodone; Tramadol; and Citrus  Home Medications   Prior to Admission medications   Medication Sig Start Date End Date Taking? Authorizing Provider  ibuprofen (ADVIL,MOTRIN) 200 MG tablet Take 400 mg by mouth  every 6 (six) hours as needed (Pain).   Yes Historical Provider, MD  acyclovir (ZOVIRAX) 800 MG tablet Take 1 tablet (800 mg total) by mouth 5 (five) times daily. 09/13/14   Tilden FossaElizabeth Rees, MD  Aspirin-Acetaminophen-Caffeine (GOODY HEADACHE PO) Take 1 each by mouth every 8 (eight) hours as needed (pain).    Historical Provider, MD  famotidine (PEPCID) 20 MG tablet Take 1 tablet (20 mg total) by mouth 2 (two) times daily. Patient not taking: Reported on 09/13/2014 03/03/14   Azalia BilisKevin Campos, MD  oxyCODONE-acetaminophen (PERCOCET/ROXICET) 5-325 MG per tablet Take 1 tablet by mouth every 6 (six) hours as needed for severe pain. 09/13/14   Tilden FossaElizabeth Rees, MD  predniSONE (DELTASONE) 10 MG tablet 60mg  PO x 3 days, 40mg  PO x 3 days, 20mg  POx 2 days, 10 mgPO x 1 day Patient not taking: Reported on 09/13/2014 03/03/14   Azalia BilisKevin Campos, MD   Triage Vitals:  BP 113/81 mmHg  Pulse 95  Temp(Src) 97.7 F (36.5 C) (Oral)  Resp 19  SpO2 100%   Physical Exam  Constitutional: He is oriented to person, place, and time. He appears well-developed and well-nourished. No distress.  HENT:  Head: Normocephalic and atraumatic.  Eyes: Conjunctivae are normal. Right eye exhibits no discharge. Left eye exhibits no discharge. No scleral icterus.  Cardiovascular: Normal rate.   Pulmonary/Chest: Effort normal.  Musculoskeletal:       Legs: Pain with adduction and abduction of left  hip Positive straight leg raise No obvious bony deformities  Neurological: He is alert and oriented to person, place, and time. Coordination normal.  No gait abnormality Strength 5/5 No sensory deficits  Skin: Skin is warm and dry. No rash noted. He is not diaphoretic. No erythema. No pallor.  Psychiatric: He has a normal mood and affect. His behavior is normal.  Nursing note and vitals reviewed.   ED Course  Procedures (including critical care time)  DIAGNOSTIC STUDIES: Oxygen Saturation is 100% on RA, normal by my interpretation.     COORDINATION OF CARE: 2:24 PM-Discussed treatment plan which includes toradol injection, Rx muscle relaxant, and referral to orthopedist with pt at bedside. Pt declined muscle relaxer prescription at this time and states he will go to Urgent Care after this to get narcotic medication.   Labs Review Labs Reviewed - No data to display  Imaging Review Dg Hip Unilat With Pelvis 2-3 Views Left  01/20/2015  CLINICAL DATA:  Acute on chronic left hip pain. EXAM: DG HIP (WITH OR WITHOUT PELVIS) 2-3V LEFT COMPARISON:  None. FINDINGS: There is severe osteoarthritis of the left hip joint with marked narrowing of the joint space with sclerosis of the femoral head and of the acetabulum with marginal osteophyte formation and multiple subcortical erosions and cysts. No fracture or dislocation. There is slight osteoarthritis of the right hip. IMPRESSION: Severe osteoarthritis of the left hip. Electronically Signed   By: Francene Boyers M.D.   On: 01/20/2015 14:03   I have personally reviewed and evaluated these images as part of my medical decision-making.   EKG Interpretation None      MDM   Final diagnoses:  Hip pain, left   Patient with history of polysubstance abuse and chronic left hip pain for the last 5-6 years presents for exacerbation of this pain over the last 2 weeks. Patient requesting narcotics. X-ray negative for acute injury. No neurological deficits. Patient ambulatory in ED. Given chronic nature of this pain, will not prescribe narcotics. Patient was given a Toradol shot in the ED and discharged on anti-inflammatories. Patient needs to follow-up with an orthopedic provider for further evaluation of his symptoms. Referral given. Return precautions outlined in patient discharge instructions. Patient is hemodynamically stable and ready for discharge.  I personally performed the services described in this documentation, which was scribed in my presence. The recorded information has been  reviewed and is accurate.      Lester Kinsman [place], PA-C 01/21/15 2108  Lavera Guise, MD 01/22/15 [phone]

## 2015-01-20 NOTE — ED Notes (Signed)
Pt c/o L hip pain x 5-6 years increasing x 2 weeks.  Pain score 9/10.  Pt reports feeling a "catch" w/ walking.  Reports taking ibuprofen w/o relief.  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

## 2016-08-08 IMAGING — CR DG HIP (WITH OR WITHOUT PELVIS) 2-3V*L*
3 series · 3 of 3 positions shown · non-contrast
Comparison: None.

CLINICAL DATA: Acute on chronic left hip pain.

EXAM:
DG HIP (WITH OR WITHOUT PELVIS) 2-3V LEFT

[t pelvis a.p.]
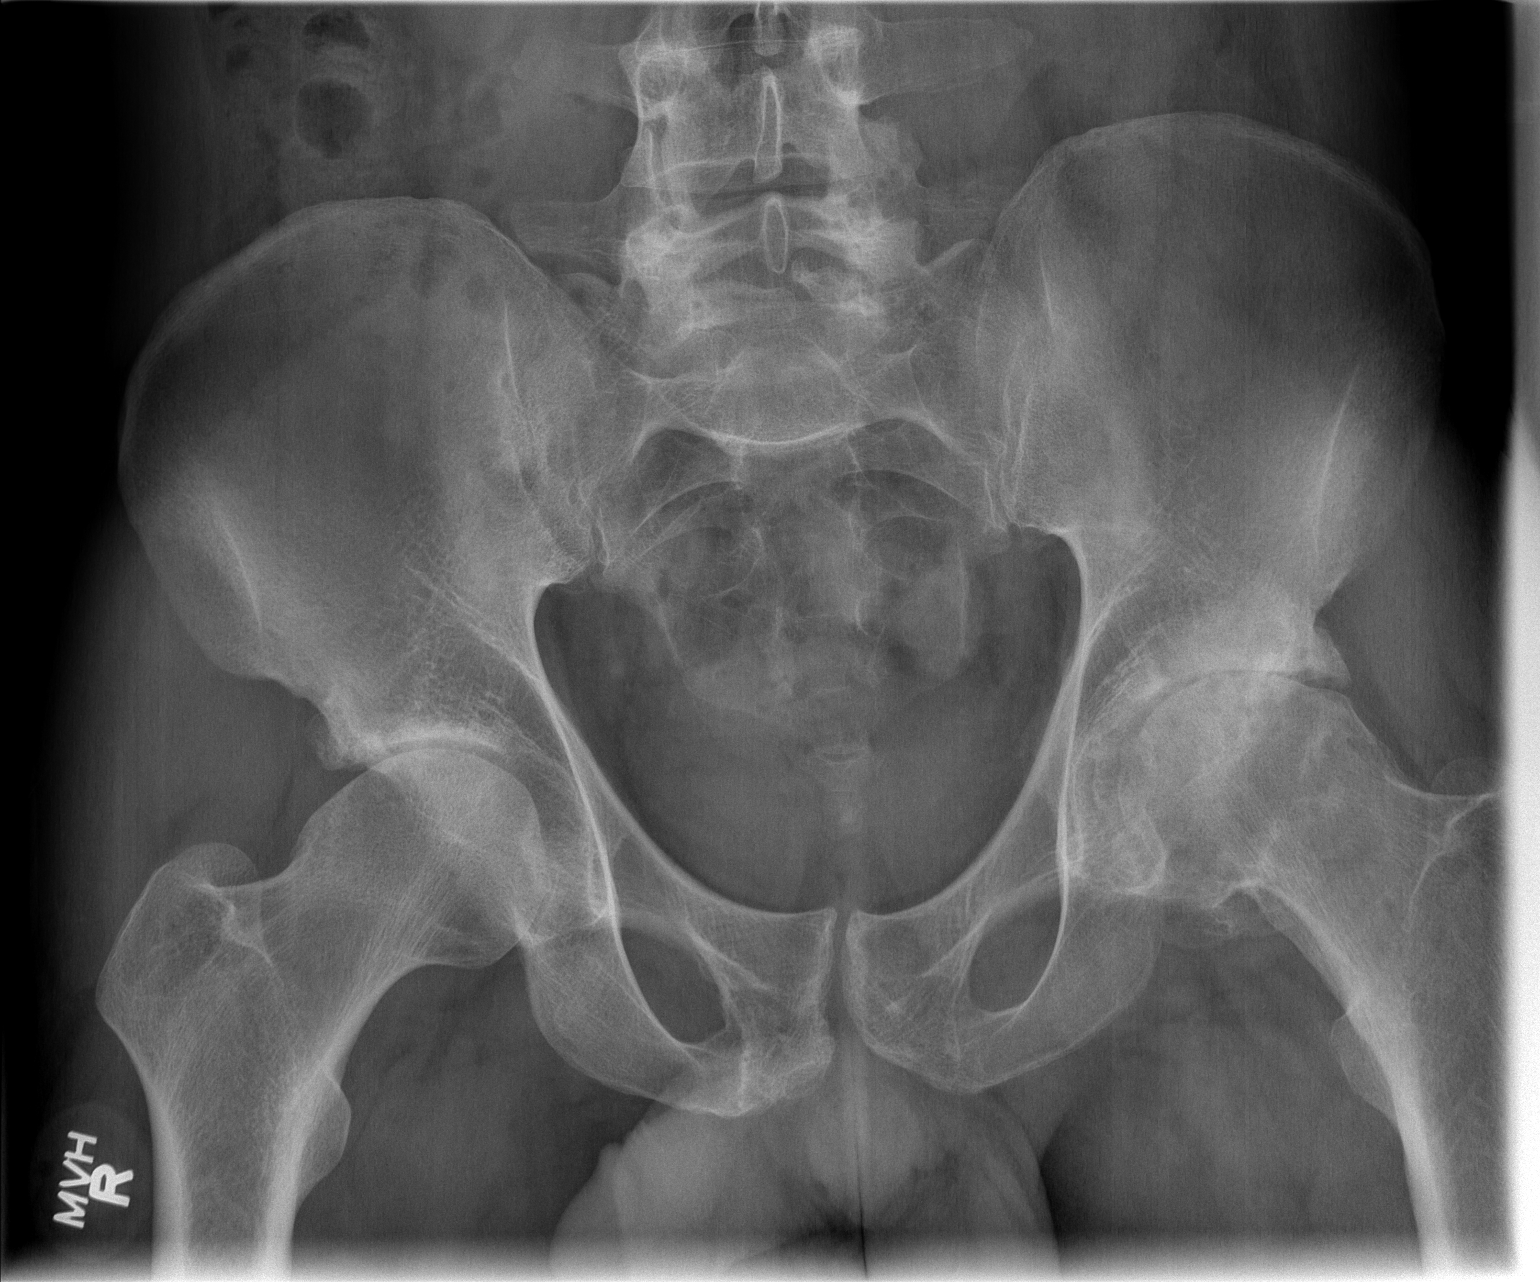

[t hip ap left]
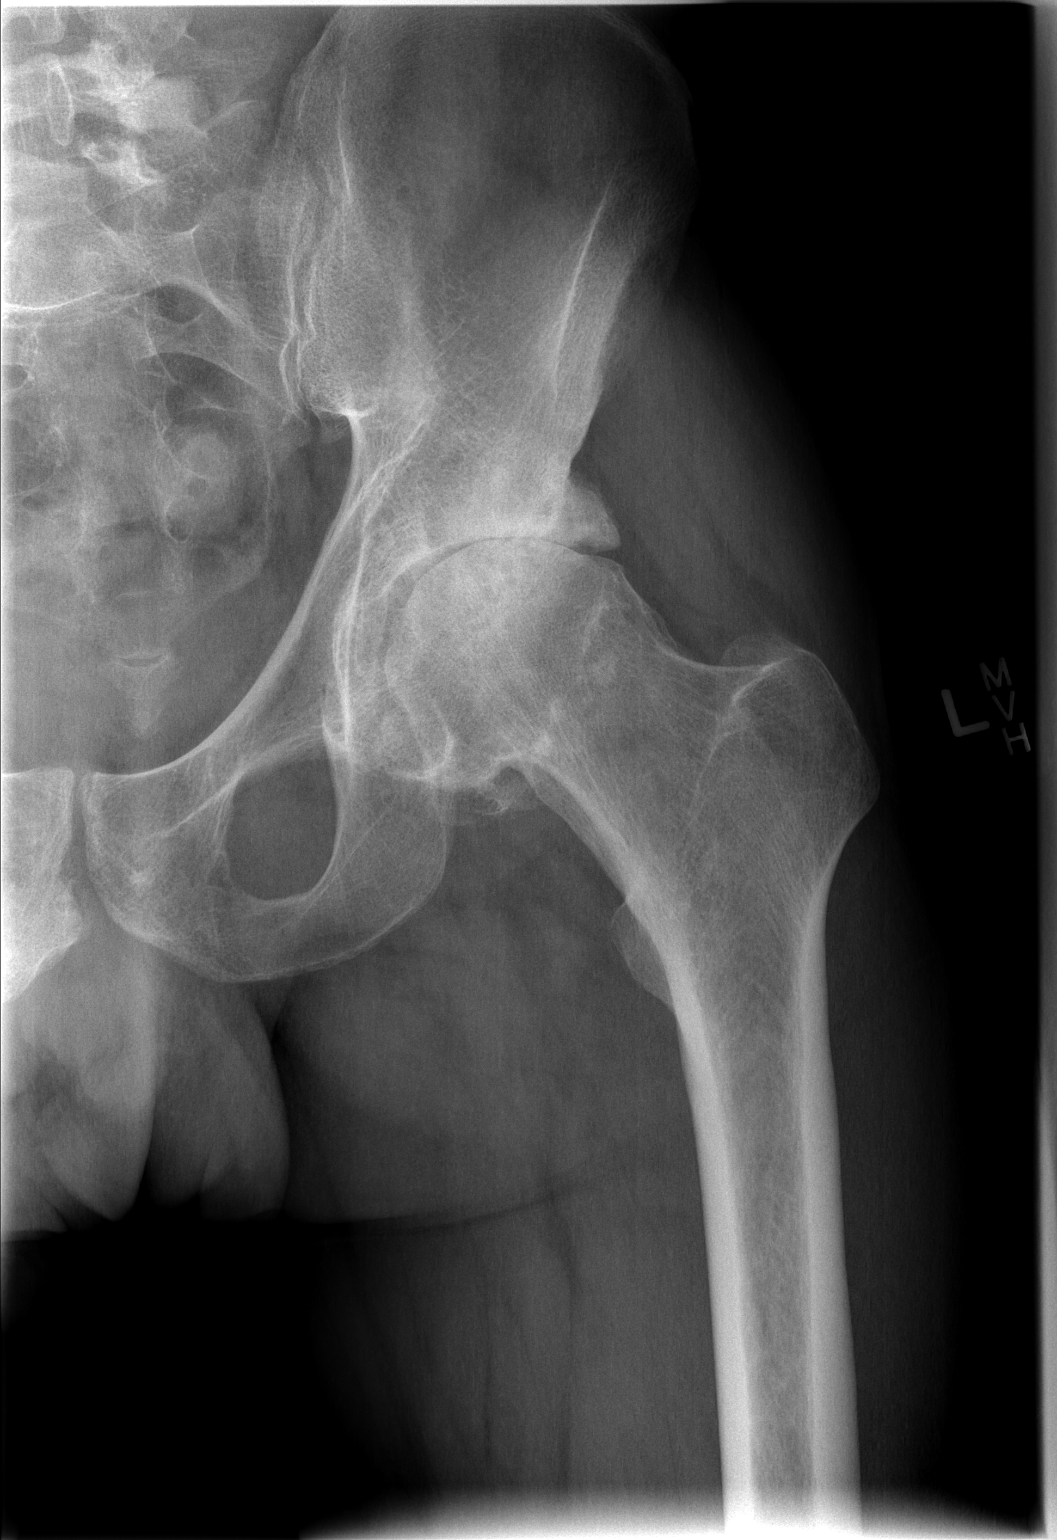

[t hip frog leg left]
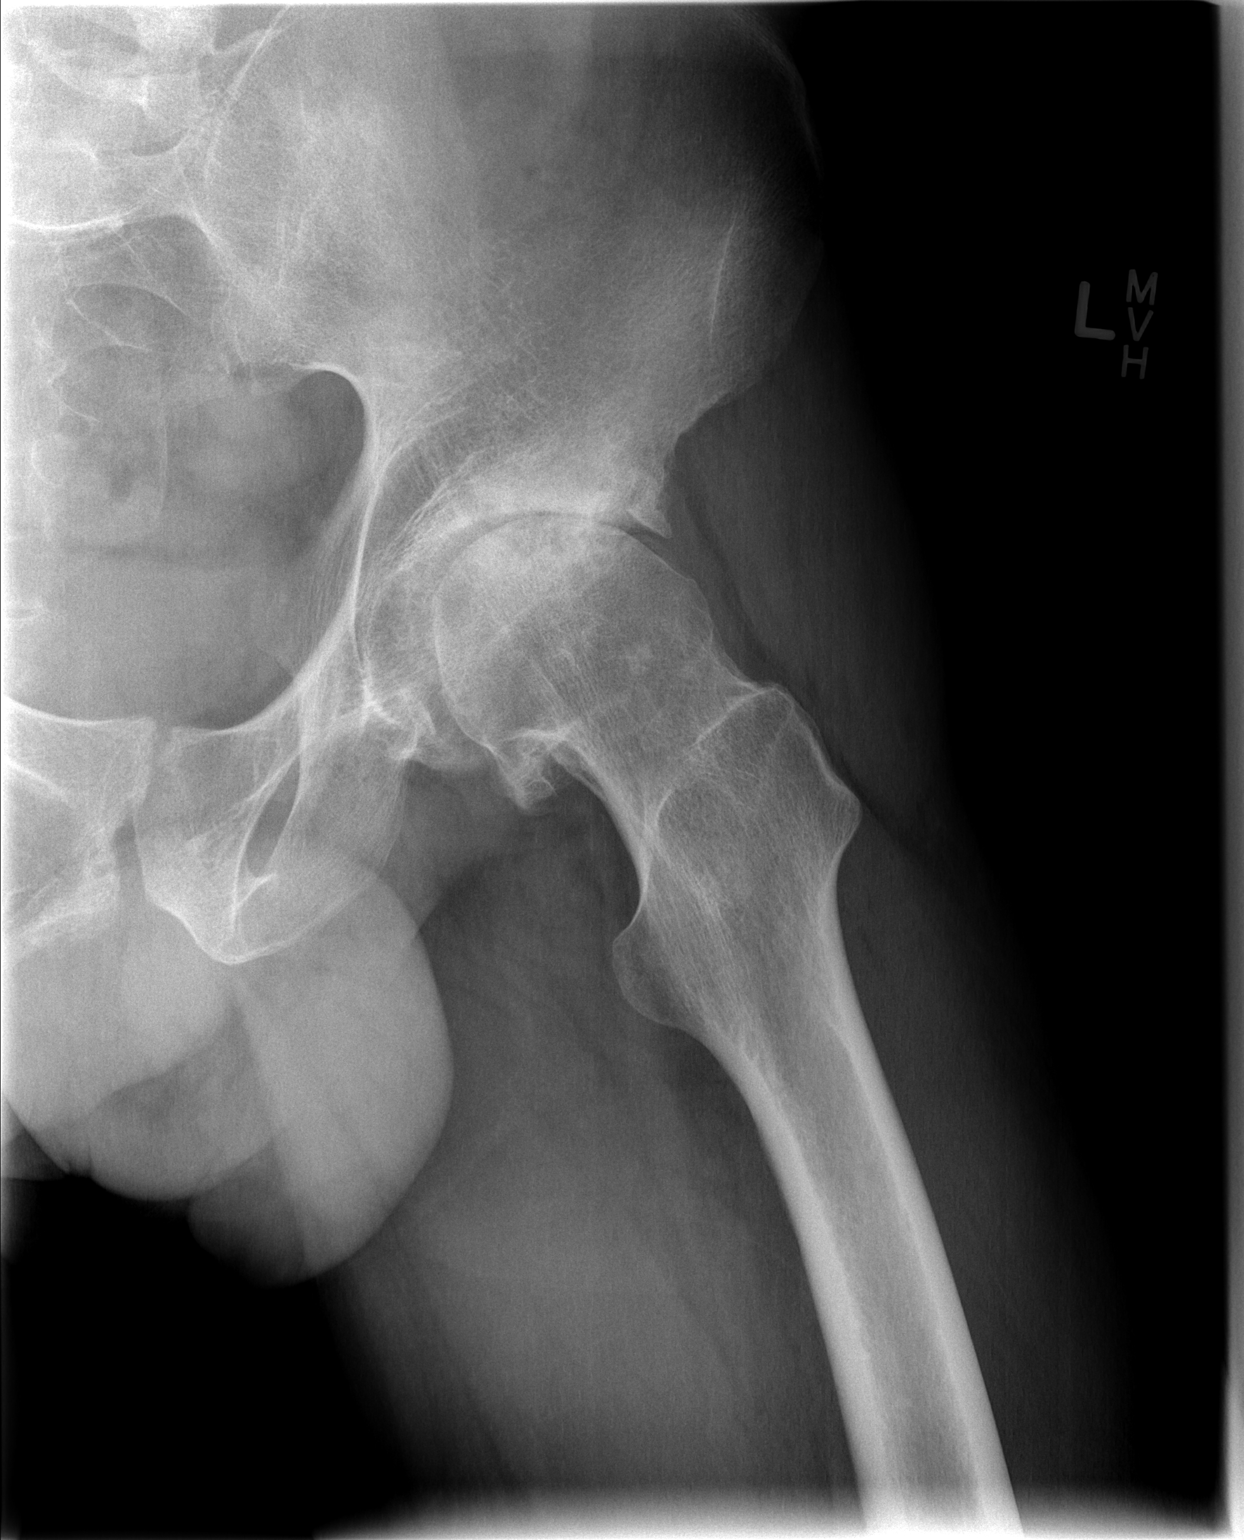

[3 of 3 positions shown; findings below may reference images not displayed]

FINDINGS: There is severe osteoarthritis of the left hip joint with marked
narrowing of the joint space with sclerosis of the femoral head and
of the acetabulum with marginal osteophyte formation and multiple
subcortical erosions and cysts. No fracture or dislocation.

There is slight osteoarthritis of the right hip.
IMPRESSION: Severe osteoarthritis of the left hip.
